# Patient Record
Sex: Female | Born: 1975 | Hispanic: No | Smoking: Never smoker
Health system: Southern US, Community
[De-identification: ages and names within clinical notes are randomized; demographics above are authoritative.]

## PROBLEM LIST (undated history)

## (undated) ENCOUNTER — Inpatient Hospital Stay (HOSPITAL_COMMUNITY): Payer: Self-pay

## (undated) DIAGNOSIS — E663 Overweight: Secondary | ICD-10-CM

## (undated) DIAGNOSIS — G43909 Migraine, unspecified, not intractable, without status migrainosus: Secondary | ICD-10-CM

## (undated) DIAGNOSIS — R51 Headache: Secondary | ICD-10-CM

## (undated) DIAGNOSIS — I10 Essential (primary) hypertension: Secondary | ICD-10-CM

## (undated) DIAGNOSIS — J302 Other seasonal allergic rhinitis: Secondary | ICD-10-CM

## (undated) HISTORY — PX: TUBAL LIGATION: SHX77

## (undated) HISTORY — DX: Migraine, unspecified, not intractable, without status migrainosus: G43.909

## (undated) HISTORY — DX: Overweight: E66.3

## (undated) HISTORY — PX: SKIN GRAFT: SHX250

## (undated) HISTORY — PX: FINGER SURGERY: SHX640

## (undated) HISTORY — PX: INDUCED ABORTION: SHX677

---

## 2006-07-11 ENCOUNTER — Emergency Department (HOSPITAL_COMMUNITY): Admission: EM | Admit: 2006-07-11 | Discharge: 2006-07-11 | Payer: Self-pay | Admitting: Emergency Medicine

## 2007-07-15 ENCOUNTER — Emergency Department (HOSPITAL_COMMUNITY): Admission: EM | Admit: 2007-07-15 | Discharge: 2007-07-15 | Payer: Self-pay | Admitting: Emergency Medicine

## 2007-08-18 ENCOUNTER — Ambulatory Visit (HOSPITAL_COMMUNITY): Admission: RE | Admit: 2007-08-18 | Discharge: 2007-08-18 | Payer: Self-pay | Admitting: Obstetrics & Gynecology

## 2010-04-12 ENCOUNTER — Emergency Department (HOSPITAL_COMMUNITY): Admission: EM | Admit: 2010-04-12 | Discharge: 2010-04-12 | Payer: Self-pay | Admitting: Emergency Medicine

## 2010-04-13 ENCOUNTER — Inpatient Hospital Stay (HOSPITAL_COMMUNITY)
Admission: AD | Admit: 2010-04-13 | Discharge: 2010-04-13 | Payer: Self-pay | Source: Home / Self Care | Admitting: Obstetrics & Gynecology

## 2010-04-15 ENCOUNTER — Inpatient Hospital Stay (HOSPITAL_COMMUNITY)
Admission: AD | Admit: 2010-04-15 | Discharge: 2010-04-15 | Payer: Self-pay | Source: Home / Self Care | Admitting: Obstetrics & Gynecology

## 2010-04-21 ENCOUNTER — Ambulatory Visit (HOSPITAL_COMMUNITY): Admission: RE | Admit: 2010-04-21 | Payer: Self-pay | Admitting: Obstetrics & Gynecology

## 2010-04-30 ENCOUNTER — Ambulatory Visit: Payer: Self-pay | Admitting: Obstetrics & Gynecology

## 2010-05-15 ENCOUNTER — Ambulatory Visit: Payer: Self-pay | Admitting: Obstetrics and Gynecology

## 2010-06-08 ENCOUNTER — Encounter: Payer: Self-pay | Admitting: Obstetrics & Gynecology

## 2010-07-29 LAB — URINALYSIS, ROUTINE W REFLEX MICROSCOPIC
Bilirubin Urine: NEGATIVE
Glucose, UA: NEGATIVE mg/dL
Specific Gravity, Urine: 1.027 (ref 1.005–1.030)
pH: 5 (ref 5.0–8.0)

## 2010-07-29 LAB — URINE MICROSCOPIC-ADD ON

## 2010-07-29 LAB — POCT PREGNANCY, URINE: Preg Test, Ur: POSITIVE

## 2010-07-29 LAB — GC/CHLAMYDIA PROBE AMP, GENITAL: GC Probe Amp, Genital: NEGATIVE

## 2010-07-30 LAB — RH IMMUNE GLOBULIN WORKUP (NOT WOMEN'S HOSP)
ABO/RH(D): A NEG
Antibody Screen: NEGATIVE
Unit division: 0

## 2010-07-30 LAB — CBC
HCT: 37.3 % (ref 36.0–46.0)
MCV: 96.9 fL (ref 78.0–100.0)
Platelets: 248 10*3/uL (ref 150–400)
RBC: 3.85 MIL/uL — ABNORMAL LOW (ref 3.87–5.11)
WBC: 9.2 10*3/uL (ref 4.0–10.5)

## 2010-07-30 LAB — GC/CHLAMYDIA PROBE AMP, GENITAL
Chlamydia, DNA Probe: NEGATIVE
GC Probe Amp, Genital: NEGATIVE

## 2010-07-30 LAB — ABO/RH: ABO/RH(D): A NEG

## 2012-03-09 ENCOUNTER — Inpatient Hospital Stay (HOSPITAL_COMMUNITY)
Admission: AD | Admit: 2012-03-09 | Discharge: 2012-03-09 | Disposition: A | Discharge status: Home / Self Care | Payer: Medicaid Other | Source: Ambulatory Visit | Attending: Obstetrics & Gynecology | Admitting: Obstetrics & Gynecology

## 2012-03-09 ENCOUNTER — Encounter (HOSPITAL_COMMUNITY): Payer: Self-pay | Admitting: *Deleted

## 2012-03-09 DIAGNOSIS — O34219 Maternal care for unspecified type scar from previous cesarean delivery: Secondary | ICD-10-CM | POA: Insufficient documentation

## 2012-03-09 DIAGNOSIS — R109 Unspecified abdominal pain: Secondary | ICD-10-CM | POA: Insufficient documentation

## 2012-03-09 DIAGNOSIS — K5900 Constipation, unspecified: Secondary | ICD-10-CM

## 2012-03-09 DIAGNOSIS — O26899 Other specified pregnancy related conditions, unspecified trimester: Secondary | ICD-10-CM

## 2012-03-09 DIAGNOSIS — O99891 Other specified diseases and conditions complicating pregnancy: Secondary | ICD-10-CM | POA: Insufficient documentation

## 2012-03-09 HISTORY — DX: Other seasonal allergic rhinitis: J30.2

## 2012-03-09 HISTORY — DX: Headache: R51

## 2012-03-09 LAB — POCT PREGNANCY, URINE: Preg Test, Ur: POSITIVE — AB

## 2012-03-09 LAB — WET PREP, GENITAL: Clue Cells Wet Prep HPF POC: NONE SEEN

## 2012-03-09 LAB — URINALYSIS, ROUTINE W REFLEX MICROSCOPIC
Bilirubin Urine: NEGATIVE
Leukocytes, UA: NEGATIVE
Nitrite: NEGATIVE
Specific Gravity, Urine: 1.015 (ref 1.005–1.030)
Urobilinogen, UA: 0.2 mg/dL (ref 0.0–1.0)
pH: 5.5 (ref 5.0–8.0)

## 2012-03-09 MED ORDER — PROMETHAZINE HCL 25 MG PO TABS: 25.0000 mg | ORAL_TABLET | Freq: Four times a day (QID) | ORAL | Status: DC | PRN

## 2012-03-09 MED ORDER — PRENATAL VITAMINS 28-0.8 MG PO TABS: 1.0000 | ORAL_TABLET | Freq: Every day | ORAL | Status: DC

## 2012-03-09 NOTE — MAU Provider Note (Signed)
History     CSN: [id number]  Arrival date and time: 03/09/12 1251   First Provider Initiated Contact with Patient 03/09/12 1512      Chief Complaint  Patient presents with  . Abdominal Pain   HPI This is a 36 y.o. female at [redacted]w[redacted]d who presents with c/o lower abdominal pain and nausea, since 1700. Had a c/s at [facility] and was told she had "everything stuck together".  Is worried this pregnancy "is dangerous because of that".  No bleeding. Does report constipation.  Wants to come here for [facility].   RN Note: Hx of 2 previous c-sections; reports problems from adhesions from her previous c-sections; unable to doppler FHR; pt thought that she was between 9-[redacted] weeks gestation but by LNMP, pt is around [redacted]w[redacted]d;denies bleeding; hx of miscarriage and is concerned that she will miscarry again;       OB History    Grav Para Term Preterm Abortions TAB SAB Ect Mult Living   5 2 2  2 1 1   2       Past Medical History  Diagnosis Date  . Headache   . Seasonal allergies     Past Surgical History  Procedure Date  . Cesarean section   . Induced abortion   . Skin graft   . Finger surgery     Family History  Problem Relation Age of Onset  . Other Neg Hx     History  Substance Use Topics  . Smoking status: Never Smoker   . Smokeless tobacco: Not on file  . Alcohol Use: No    Allergies:  Allergies  Allergen Reactions  . Zofran (Ondansetron Hcl) Other (See Comments)    Headache  . Benadryl (Diphenhydramine Hcl) Hives, Swelling and Rash    Prescriptions prior to admission  Medication Sig Dispense Refill  . HYDROcodone-acetaminophen (NORCO/VICODIN) 5-325 MG per tablet Take 1 tablet by mouth every 6 (six) hours as needed. For pain      . DISCONTD: bismuth subsalicylate (PEPTO BISMOL) 262 MG/15ML suspension Take 15 mLs by mouth every 6 (six) hours as needed. For nausea/upset stomach        ROS See HPI  Physical Exam   Blood pressure 136/62, pulse 115, temperature 99 F (37.2 C),  temperature source Oral, resp. rate 16, height 5\' 2"  (1.575 m), weight 122 lb (55.339 kg), last menstrual period 01/11/2012.  Physical Exam  Constitutional: She is oriented to person, place, and time. She appears well-developed and well-nourished. No distress.  Cardiovascular: Normal rate.   Respiratory: Effort normal.  GI: Soft. She exhibits no distension and no mass. There is tenderness (slight tenderness lower abdomen, no focal tenderness). There is no rebound and no guarding.  Genitourinary: Vagina normal and uterus normal. No vaginal discharge found.  Musculoskeletal: Normal range of motion.  Neurological: She is alert and oriented to person, place, and time.  Skin: Skin is warm and dry.  Psychiatric: She has a normal mood and affect.   Bedside US revealed active 10-12 week fetus FHR 150s  MAU Course  Procedures  MDM GC/Ch/wet prep done  Results for orders placed during the hospital encounter of 03/09/12 (from the past 24 hour(s))  URINALYSIS, ROUTINE W REFLEX MICROSCOPIC     Status: Abnormal   Collection Time   03/09/12  1:20 PM      Component Value Range   Color, Urine YELLOW  YELLOW   APPearance HAZY (*) CLEAR   Specific Gravity, Urine 1.015  1.005 - 1.030  pH 5.5  5.0 - 8.0   Glucose, UA NEGATIVE  NEGATIVE mg/dL   Hgb urine dipstick NEGATIVE  NEGATIVE   Bilirubin Urine NEGATIVE  NEGATIVE   Ketones, ur NEGATIVE  NEGATIVE mg/dL   Protein, ur NEGATIVE  NEGATIVE mg/dL   Urobilinogen, UA 0.2  0.0 - 1.0 mg/dL   Nitrite NEGATIVE  NEGATIVE   Leukocytes, UA NEGATIVE  NEGATIVE  POCT PREGNANCY, URINE     Status: Abnormal   Collection Time   03/09/12  1:35 PM      Component Value Range   Preg Test, Ur POSITIVE (*) NEGATIVE  WET PREP, GENITAL     Status: Abnormal   Collection Time   03/09/12  2:35 PM      Component Value Range   Yeast Wet Prep HPF POC NONE SEEN  NONE SEEN   Trich, Wet Prep NONE SEEN  NONE SEEN   Clue Cells Wet Prep HPF POC NONE SEEN  NONE SEEN   WBC,  Wet Prep HPF POC FEW (*) NONE SEEN     Assessment and Plan  A:  SIUP at [redacted]w[redacted]d by LMP, 10-12 wks by appearance, though no CRL was done      Abdominal cramping probably related to constipation.      Previous C/S            P:  Discharge home      Reassured       Recommend Miralax PRN       Start PNC. Wants to come to Clinic for [facility] Already has Medicaid  [facility] 03/09/2012, 3:12 PM

## 2012-03-09 NOTE — MAU Note (Signed)
Hx of 2 previous c-sections; reports problems from adhesions from her previous c-sections; unable to doppler FHR; pt thought that she was between 9-[redacted] weeks gestation but by LNMP, pt is around [redacted]w[redacted]d;denies bleeding; hx of miscarriage and is concerned that she will miscarry again;

## 2012-03-09 NOTE — MAU Note (Signed)
C/o abdominal pain since yesterday around 1700; denies vaginal bleeding;

## 2012-03-10 LAB — GC/CHLAMYDIA PROBE AMP, GENITAL
Chlamydia, DNA Probe: NEGATIVE
GC Probe Amp, Genital: NEGATIVE

## 2012-03-11 ENCOUNTER — Encounter: Payer: Self-pay | Admitting: Advanced Practice Midwife

## 2012-03-18 NOTE — MAU Provider Note (Signed)
Medical Screening exam and patient care preformed by advanced practice provider.  Agree with the above management.  

## 2012-03-30 ENCOUNTER — Encounter: Payer: Medicaid Other | Admitting: Advanced Practice Midwife

## 2012-08-14 ENCOUNTER — Encounter (HOSPITAL_COMMUNITY): Payer: Self-pay

## 2012-08-14 ENCOUNTER — Inpatient Hospital Stay (HOSPITAL_COMMUNITY)
Admission: AD | Admit: 2012-08-14 | Discharge: 2012-08-14 | Disposition: A | Discharge status: Home / Self Care | Payer: Medicaid Other | Source: Ambulatory Visit | Attending: Obstetrics & Gynecology | Admitting: Obstetrics & Gynecology

## 2012-08-14 DIAGNOSIS — O99891 Other specified diseases and conditions complicating pregnancy: Secondary | ICD-10-CM | POA: Insufficient documentation

## 2012-08-14 DIAGNOSIS — N949 Unspecified condition associated with female genital organs and menstrual cycle: Secondary | ICD-10-CM | POA: Insufficient documentation

## 2012-08-14 DIAGNOSIS — O4700 False labor before 37 completed weeks of gestation, unspecified trimester: Secondary | ICD-10-CM | POA: Insufficient documentation

## 2012-08-14 DIAGNOSIS — R109 Unspecified abdominal pain: Secondary | ICD-10-CM | POA: Insufficient documentation

## 2012-08-14 DIAGNOSIS — K5900 Constipation, unspecified: Secondary | ICD-10-CM | POA: Insufficient documentation

## 2012-08-14 LAB — URINALYSIS, ROUTINE W REFLEX MICROSCOPIC
Bilirubin Urine: NEGATIVE
Glucose, UA: 250 mg/dL — AB
Hgb urine dipstick: NEGATIVE
Specific Gravity, Urine: 1.02 (ref 1.005–1.030)
Urobilinogen, UA: 0.2 mg/dL (ref 0.0–1.0)
pH: 6.5 (ref 5.0–8.0)

## 2012-08-14 NOTE — MAU Note (Signed)
Pt reports feeling not well this early this morning. Pt reports pressure and cramping and an increase in FM. Denies bleeding or leaking of fluid.

## 2012-08-14 NOTE — MAU Provider Note (Signed)
History     CSN: [id number]  Arrival date and time: 08/14/12 4098   First Provider Initiated Contact with Patient 08/14/12 0113      Chief Complaint  Patient presents with  . Abdominal Cramping   HPI  Pt is a [id number] here at 34.2 wks IUP with report of pelvic pressure and not  feeling well early this morning. Pt reports pressure and cramping and an increase in FM. Denies bleeding or leaking of fluid.  Began prenatal care at [facility] during the first trimester.  Reports no problems during the pregnancy.     Past Medical History  Diagnosis Date  . Headache   . Seasonal allergies     Past Surgical History  Procedure Laterality Date  . Cesarean section    . Induced abortion    . Skin graft    . Finger surgery      Family History  Problem Relation Age of Onset  . Other Neg Hx     History  Substance Use Topics  . Smoking status: Never Smoker   . Smokeless tobacco: Not on file  . Alcohol Use: No    Allergies:  Allergies  Allergen Reactions  . Zofran (Ondansetron Hcl) Other (See Comments)    Headache  . Benadryl (Diphenhydramine Hcl) Hives, Swelling and Rash    Prescriptions prior to admission  Medication Sig Dispense Refill  . Prenatal Vit-Fe Fumarate-FA (PRENATAL VITAMINS) 28-0.8 MG TABS Take 1 tablet by mouth daily.  30 tablet  12  . HYDROcodone-acetaminophen (NORCO/VICODIN) 5-325 MG per tablet Take 1 tablet by mouth every 6 (six) hours as needed. For pain      . promethazine (PHENERGAN) 25 MG tablet Take 1 tablet (25 mg total) by mouth every 6 (six) hours as needed for nausea.  30 tablet  0    Review of Systems  Gastrointestinal: Positive for abdominal pain (pressure in vaginal area) and constipation.  All other systems reviewed and are negative.   Physical Exam   Blood pressure 126/70, pulse 97, temperature 98.7 F (37.1 C), temperature source Oral, resp. rate 18, height 5\' 2"  (1.575 m), weight 71.396 kg (157 lb 6.4 oz), last menstrual period  01/11/2012.  Physical Exam  Constitutional: She is oriented to person, place, and time. She appears well-developed and well-nourished.  HENT:  Head: Normocephalic.  Neck: Normal range of motion. Neck supple.  Cardiovascular: Normal rate, regular rhythm and normal heart sounds.   Respiratory: Effort normal and breath sounds normal.  GI: Soft. There is no tenderness.  Genitourinary: No bleeding around the vagina. Vaginal discharge (mucusy) found.  Feces palpated in rectum thru vaginal wall  Neurological: She is alert and oriented to person, place, and time.  Skin: Skin is warm and dry.    MAU Course  Procedures  Results for orders placed during the hospital encounter of 08/14/12 (from the past 24 hour(s))  URINALYSIS, ROUTINE W REFLEX MICROSCOPIC     Status: Abnormal   Collection Time    08/14/12 12:31 AM      Result Value Range   Color, Urine YELLOW  YELLOW   APPearance CLEAR  CLEAR   Specific Gravity, Urine 1.020  1.005 - 1.030   pH 6.5  5.0 - 8.0   Glucose, UA 250 (*) NEGATIVE mg/dL   Hgb urine dipstick NEGATIVE  NEGATIVE   Bilirubin Urine NEGATIVE  NEGATIVE   Ketones, ur NEGATIVE  NEGATIVE mg/dL   Protein, ur NEGATIVE  NEGATIVE mg/dL   Urobilinogen, UA  0.2  0.0 - 1.0 mg/dL   Nitrite NEGATIVE  NEGATIVE   Leukocytes, UA NEGATIVE  NEGATIVE   Dilation: 1 Effacement (%): Thick Cervical Position: Posterior Station: Ballotable Presentation: Vertex Exam by:: B Mosca RN  Assessment and Plan  Constipation Category I FHR Tracing Braxton Hick's - no cervical change  Plan: DC to home Miralax Keep scheduled appt Preterm labor precautions.  [facility] 08/14/2012, 1:15 AM

## 2012-08-17 NOTE — MAU Provider Note (Signed)
Attestation of Attending Supervision of Advanced Practitioner (PA/CNM/NP): Evaluation and management procedures were performed by the Advanced Practitioner under my supervision and collaboration.  I have reviewed the Advanced Practitioner's note and chart, and I agree with the management and plan.  Kaedan Richert, MD, FACOG Attending Obstetrician & Gynecologist Faculty Practice, [facility]  

## 2012-09-29 ENCOUNTER — Telehealth (HOSPITAL_COMMUNITY): Payer: Self-pay | Admitting: *Deleted

## 2012-09-29 NOTE — Telephone Encounter (Signed)
Preadmission screen  

## 2013-01-12 ENCOUNTER — Encounter (HOSPITAL_COMMUNITY): Payer: Self-pay | Admitting: *Deleted

## 2014-03-19 ENCOUNTER — Encounter (HOSPITAL_COMMUNITY): Payer: Self-pay | Admitting: *Deleted

## 2014-05-08 ENCOUNTER — Emergency Department (HOSPITAL_COMMUNITY)
Admission: EM | Admit: 2014-05-08 | Discharge: 2014-05-08 | Disposition: A | Discharge status: Home / Self Care | Payer: Medicaid Other | Attending: Emergency Medicine | Admitting: Emergency Medicine

## 2014-05-08 ENCOUNTER — Emergency Department (HOSPITAL_COMMUNITY): Payer: Medicaid Other

## 2014-05-08 ENCOUNTER — Encounter (HOSPITAL_COMMUNITY): Payer: Self-pay

## 2014-05-08 DIAGNOSIS — R067 Sneezing: Secondary | ICD-10-CM | POA: Insufficient documentation

## 2014-05-08 DIAGNOSIS — Y929 Unspecified place or not applicable: Secondary | ICD-10-CM | POA: Insufficient documentation

## 2014-05-08 DIAGNOSIS — I10 Essential (primary) hypertension: Secondary | ICD-10-CM | POA: Insufficient documentation

## 2014-05-08 DIAGNOSIS — S00531A Contusion of lip, initial encounter: Secondary | ICD-10-CM | POA: Insufficient documentation

## 2014-05-08 DIAGNOSIS — T07XXXA Unspecified multiple injuries, initial encounter: Secondary | ICD-10-CM

## 2014-05-08 DIAGNOSIS — S3991XA Unspecified injury of abdomen, initial encounter: Secondary | ICD-10-CM | POA: Insufficient documentation

## 2014-05-08 DIAGNOSIS — S0083XA Contusion of other part of head, initial encounter: Secondary | ICD-10-CM | POA: Insufficient documentation

## 2014-05-08 DIAGNOSIS — Y998 Other external cause status: Secondary | ICD-10-CM | POA: Insufficient documentation

## 2014-05-08 DIAGNOSIS — Y9389 Activity, other specified: Secondary | ICD-10-CM | POA: Insufficient documentation

## 2014-05-08 HISTORY — DX: Essential (primary) hypertension: I10

## 2014-05-08 MED ORDER — HYDROCODONE-ACETAMINOPHEN 5-325 MG PO TABS
1.0000 | ORAL_TABLET | Freq: Once | ORAL | Status: AC
  Administered 2014-05-08: 1 via ORAL
  Filled 2014-05-08: qty 1

## 2014-05-08 MED ORDER — DIAZEPAM 5 MG PO TABS
5.0000 mg | ORAL_TABLET | Freq: Once | ORAL | Status: AC
  Administered 2014-05-08: 5 mg via ORAL
  Filled 2014-05-08: qty 1

## 2014-05-08 MED ORDER — HYDROCODONE-ACETAMINOPHEN 5-325 MG PO TABS: 1.0000 | ORAL_TABLET | ORAL | Status: DC | PRN
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
## 2014-05-08 NOTE — Discharge Instructions (Signed)
Your xrays and ct are negative for fracture or dislocation. Cool compress to the forehead may be helpful. Use tylenol or ibuprofen for mild pain. Use norco for more severe pain. Assault, General Assault includes any behavior, whether intentional or reckless, which results in bodily injury to another person and/or damage to property. Included in this would be any behavior, intentional or reckless, that by its nature would be understood (interpreted) by a reasonable person as intent to harm another person or to damage his/her property. Threats may be oral or written. They may be communicated through regular mail, computer, fax, or phone. These threats may be direct or implied. FORMS OF ASSAULT INCLUDE:  Physically assaulting a person. This includes physical threats to inflict physical harm as well as:  Slapping.  Hitting.  Poking.  Kicking.  Punching.  Pushing.  Arson.  Sabotage.  Equipment vandalism.  Damaging or destroying property.  Throwing or hitting objects.  Displaying a weapon or an object that appears to be a weapon in a threatening manner.  Carrying a firearm of any kind.  Using a weapon to harm someone.  Using greater physical size/strength to intimidate another.  Making intimidating or threatening gestures.  Bullying.  Hazing.  Intimidating, threatening, hostile, or abusive language directed toward another person.  It communicates the intention to engage in violence against that person. And it leads a reasonable person to expect that violent behavior may occur.  Stalking another person. IF IT HAPPENS AGAIN:  Immediately call for emergency help (911 in [place]  If someone poses clear and immediate danger to you, seek legal authorities to have a protective or restraining order put in place.  Less threatening assaults can at least be reported to authorities. STEPS TO TAKE IF A SEXUAL ASSAULT HAS HAPPENED  Go to an area of safety. This may include a  shelter or staying with a friend. Stay away from the area where you have been attacked. A large percentage of sexual assaults are caused by a friend, relative or associate.  If medications were given by your caregiver, take them as directed for the full length of time prescribed.  Only take over-the-counter or prescription medicines for pain, discomfort, or fever as directed by your caregiver.  If you have come in contact with a sexual disease, find out if you are to be tested again. If your caregiver is concerned about the HIV/AIDS virus, he/she may require you to have continued testing for several months.  For the protection of your privacy, test results can not be given over the phone. Make sure you receive the results of your test. If your test results are not back during your visit, make an appointment with your caregiver to find out the results. Do not assume everything is normal if you have not heard from your caregiver or the medical facility. It is important for you to follow up on all of your test results.  File appropriate papers with authorities. This is important in all assaults, even if it has occurred in a family or by a friend. SEEK MEDICAL CARE IF:  You have new problems because of your injuries.  You have problems that may be because of the medicine you are taking, such as:  Rash.  Itching.  Swelling.  Trouble breathing.  You develop belly (abdominal) pain, feel sick to your stomach (nausea) or are vomiting.  You begin to run a temperature.  You need supportive care or referral to a rape crisis center. These are centers with  trained personnel who can help you get through this ordeal. SEEK IMMEDIATE MEDICAL CARE IF:  You are afraid of being threatened, beaten, or abused. In [place] call 911.  You receive new injuries related to abuse.  You develop severe pain in any area injured in the assault or have any change in your condition that concerns you.  You faint or  lose consciousness.  You develop chest pain or shortness of breath. Document Released: 05/04/2005 Document Revised: 07/27/2011 Document Reviewed: 12/21/2007 [facility] Patient Information 2015 [employer], [place]. This information is not intended to replace advice given to you by your health care provider. Make sure you discuss any questions you have with your health care provider.

## 2014-05-09 LAB — POC URINE PREG, ED: PREG TEST UR: NEGATIVE

## 2015-04-02 ENCOUNTER — Other Ambulatory Visit (HOSPITAL_COMMUNITY): Payer: Self-pay | Admitting: Nurse Practitioner

## 2015-04-02 ENCOUNTER — Ambulatory Visit (HOSPITAL_COMMUNITY)
Admission: RE | Admit: 2015-04-02 | Discharge: 2015-04-02 | Disposition: A | Discharge status: Home / Self Care | Payer: BLUE CROSS/BLUE SHIELD | Source: Ambulatory Visit | Attending: Nurse Practitioner | Admitting: Nurse Practitioner

## 2015-04-02 DIAGNOSIS — J069 Acute upper respiratory infection, unspecified: Secondary | ICD-10-CM | POA: Insufficient documentation

## 2015-04-02 DIAGNOSIS — R918 Other nonspecific abnormal finding of lung field: Secondary | ICD-10-CM | POA: Diagnosis not present

## 2015-04-02 DIAGNOSIS — R0600 Dyspnea, unspecified: Secondary | ICD-10-CM | POA: Insufficient documentation
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
## 2016-04-14 ENCOUNTER — Encounter (HOSPITAL_COMMUNITY): Payer: Self-pay | Admitting: *Deleted

## 2016-04-14 ENCOUNTER — Emergency Department (HOSPITAL_COMMUNITY)
Admission: EM | Admit: 2016-04-14 | Discharge: 2016-04-14 | Disposition: A | Discharge status: Home / Self Care | Payer: BLUE CROSS/BLUE SHIELD | Attending: Emergency Medicine | Admitting: Emergency Medicine

## 2016-04-14 DIAGNOSIS — T161XXA Foreign body in right ear, initial encounter: Secondary | ICD-10-CM

## 2016-04-14 DIAGNOSIS — I10 Essential (primary) hypertension: Secondary | ICD-10-CM | POA: Insufficient documentation

## 2016-04-14 DIAGNOSIS — Y999 Unspecified external cause status: Secondary | ICD-10-CM | POA: Insufficient documentation

## 2016-04-14 DIAGNOSIS — Z79899 Other long term (current) drug therapy: Secondary | ICD-10-CM | POA: Insufficient documentation

## 2016-04-14 DIAGNOSIS — Y939 Activity, unspecified: Secondary | ICD-10-CM | POA: Insufficient documentation

## 2016-04-14 DIAGNOSIS — X58XXXA Exposure to other specified factors, initial encounter: Secondary | ICD-10-CM | POA: Insufficient documentation

## 2016-04-14 DIAGNOSIS — Y929 Unspecified place or not applicable: Secondary | ICD-10-CM | POA: Insufficient documentation

## 2016-04-14 MED ORDER — NEOMYCIN-POLYMYXIN-HC 1 % OT SOLN
4.0000 [drp] | Freq: Four times a day (QID) | OTIC | Status: DC
  Administered 2016-04-14: 4 [drp] via OTIC
  Filled 2016-04-14: qty 10

## 2016-04-14 MED ORDER — LIDOCAINE VISCOUS 2 % MT SOLN
15.0000 mL | Freq: Once | OROMUCOSAL | Status: AC
  Administered 2016-04-14: 15 mL via OROMUCOSAL
  Filled 2016-04-14: qty 15

## 2016-04-14 NOTE — ED Triage Notes (Signed)
Pt states she woke up to feel something crawling in her right ear

## 2016-04-14 NOTE — Discharge Instructions (Signed)
Use the ear drops 4 times a day to prevent an infection in your ear canal from the roach.  Call Dr Avel Sensoreoh's office in the morning to get an appointment to have him remove the bug.

## 2016-04-14 NOTE — ED Provider Notes (Signed)
[facility]-EMERGENCY DEPT Provider Note   CSN: [id number] Arrival date & time: 04/14/16  0201  Time seen 03:00 AM   History   Chief Complaint Chief Complaint  Patient presents with  . Foreign Body in Ear    HPI Gloria Parsons is a 40 y.o. female.  HPI patient reports she was sleeping and she felt like something was in her right ear about 1 AM. She states she continues to feel like something is moving around in her right ear canal. She states she's never had this before.  PCP none  Past Medical History:  Diagnosis Date  . Headache(784.0)   . Hypertension   . Seasonal allergies     There are no active problems to display for this patient.   Past Surgical History:  Procedure Laterality Date  . CESAREAN SECTION    . FINGER SURGERY    . INDUCED ABORTION    . SKIN GRAFT    . TUBAL LIGATION      OB History    Gravida Para Term Preterm AB Living   5 2 2   2 2    SAB TAB Ectopic Multiple Live Births   1 1     2        Home Medications    Prior to Admission medications   Medication Sig Start Date End Date Taking? Authorizing Provider  HYDROcodone-acetaminophen (NORCO/VICODIN) 5-325 MG per tablet Take 1 tablet by mouth every 4 (four) hours as needed. 05/08/14   Ivery QualeHobson Bryant, PA-C  Multiple Vitamin (MULTIVITAMIN WITH MINERALS) TABS tablet Take 1 tablet by mouth daily.    Historical Provider, MD  Prenatal Vit-Fe Fumarate-FA (PRENATAL VITAMINS) 28-0.8 MG TABS Take 1 tablet by mouth daily. Patient not taking: Reported on 05/08/2014 03/09/12   Aviva SignsMarie L Williams, CNM    Family History Family History  Problem Relation Age of Onset  . Other Neg Hx     Social History Social History  Substance Use Topics  . Smoking status: Never Smoker  . Smokeless tobacco: Never Used  . Alcohol use No     Allergies   Zofran [ondansetron hcl] and Benadryl [diphenhydramine hcl]   Review of Systems Review of Systems  All other systems reviewed and are negative.    Physical  Exam Updated Vital Signs BP 142/86   Pulse 112   Temp 98.1 F (36.7 C)   Resp 17   Ht 5\' 3"  (1.6 m)   Wt 155 lb (70.3 kg)   LMP 03/31/2016   SpO2 100%   BMI 27.46 kg/m   Vital signs normal except for tachycardia   Physical Exam  Constitutional: She is oriented to person, place, and time. She appears well-developed and well-nourished.  HENT:  Head: Normocephalic.  Left Ear: External ear normal.  Nose: Nose normal.  There is a bug in her right ear canal that appears to be a roach  Eyes: Conjunctivae and EOM are normal. Pupils are equal, round, and reactive to light.  Neck: Normal range of motion.  Cardiovascular: Normal rate.   Pulmonary/Chest: She is in respiratory distress.  Musculoskeletal: Normal range of motion.  Neurological: She is alert and oriented to person, place, and time. A cranial nerve deficit is present.  Skin: Skin is warm and dry. No rash noted.  Psychiatric: Her speech is normal and behavior is normal. Her mood appears anxious.      Procedures Procedures (including critical care time)  Medications Ordered in ED Medications  NEOMYCIN-POLYMYXIN-HYDROCORTISONE (CORTISPORIN) otic solution 4 drop (  not administered)  lidocaine (XYLOCAINE) 2 % viscous mouth solution 15 mL (15 mLs Mouth/Throat Given 04/14/16 0305)     Initial Impression / Assessment and Plan / ED Course  I have reviewed the triage vital signs and the nursing notes.  Pertinent labs & imaging results that were available during my care of the patient were reviewed by me and considered in my medical decision making (see chart for details).  Clinical Course    Viscous Xylocaine was placed in the right ear canal to kill the insect. Nursing staff and attempted to flush the bug out of her ear canal. However the insect did move closer to her TM. At 4:10 AM when I rechecked her it was sitting on her TM. She is no longer feeling the bug moving is not having any pain. At this point I feel the bug is  too far in her ear canal for me to try to attempt removal without causing some injury to her tympanic membrane or ear canal. She is being referred to Dr.Teoh, ENT. She was started on Cortisporin otic to help prevent infection of her ear canal from the trauma of the live insect.  Final Clinical Impressions(s) / ED Diagnoses   Final diagnoses:  Foreign body of right ear, initial encounter   Medication Cortisporin otic 4 drops in right ear 4 times a day  Plan discharge  Devoria AlbeIva Kinan Safley, MD, Concha PyoFACEP     Skii Cleland, MD 04/14/16 [phone]

## 2016-04-15 DIAGNOSIS — T161XXA Foreign body in right ear, initial encounter: Secondary | ICD-10-CM | POA: Insufficient documentation

## 2018-03-10 ENCOUNTER — Encounter (HOSPITAL_COMMUNITY): Payer: Self-pay | Admitting: Emergency Medicine

## 2018-03-10 ENCOUNTER — Emergency Department (HOSPITAL_COMMUNITY)
Admission: EM | Admit: 2018-03-10 | Discharge: 2018-03-10 | Disposition: A | Discharge status: Home / Self Care | Payer: Self-pay | Attending: Emergency Medicine | Admitting: Emergency Medicine

## 2018-03-10 ENCOUNTER — Other Ambulatory Visit: Payer: Self-pay

## 2018-03-10 ENCOUNTER — Emergency Department (HOSPITAL_COMMUNITY): Payer: Self-pay

## 2018-03-10 DIAGNOSIS — J302 Other seasonal allergic rhinitis: Secondary | ICD-10-CM | POA: Insufficient documentation

## 2018-03-10 DIAGNOSIS — R000 Tachycardia, unspecified: Secondary | ICD-10-CM | POA: Insufficient documentation

## 2018-03-10 DIAGNOSIS — Z79899 Other long term (current) drug therapy: Secondary | ICD-10-CM | POA: Insufficient documentation

## 2018-03-10 DIAGNOSIS — I10 Essential (primary) hypertension: Secondary | ICD-10-CM | POA: Insufficient documentation

## 2018-03-10 DIAGNOSIS — F439 Reaction to severe stress, unspecified: Secondary | ICD-10-CM

## 2018-03-10 DIAGNOSIS — F419 Anxiety disorder, unspecified: Secondary | ICD-10-CM | POA: Insufficient documentation

## 2018-03-10 LAB — BASIC METABOLIC PANEL
ANION GAP: 9 (ref 5–15)
BUN: 5 mg/dL — ABNORMAL LOW (ref 6–20)
CHLORIDE: 108 mmol/L (ref 98–111)
CO2: 23 mmol/L (ref 22–32)
Calcium: 9.5 mg/dL (ref 8.9–10.3)
Creatinine, Ser: 0.82 mg/dL (ref 0.44–1.00)
GFR calc non Af Amer: >60 mL/min (ref >60)
GLUCOSE: 114 mg/dL — AB (ref 70–99)
POTASSIUM: 3.3 mmol/L — AB (ref 3.5–5.1)
Sodium: 140 mmol/L (ref 135–145)

## 2018-03-10 LAB — CBC
HEMATOCRIT: 43.6 % (ref 36.0–46.0)
HEMOGLOBIN: 14.1 g/dL (ref 12.0–15.0)
MCH: 30.5 pg (ref 26.0–34.0)
MCHC: 32.3 g/dL (ref 30.0–36.0)
MCV: 94.2 fL (ref 80.0–100.0)
NRBC: 0 % (ref 0.0–0.2)
Platelets: 407 10*3/uL — ABNORMAL HIGH (ref 150–400)
RBC: 4.63 MIL/uL (ref 3.87–5.11)
RDW: 12.4 % (ref 11.5–15.5)
WBC: 8.6 10*3/uL (ref 4.0–10.5)

## 2018-03-10 LAB — I-STAT BETA HCG BLOOD, ED (MC, WL, AP ONLY): I-stat hCG, quantitative: <5 m[IU]/mL (ref <5)

## 2018-03-10 LAB — BRAIN NATRIURETIC PEPTIDE: B NATRIURETIC PEPTIDE 5: 24 pg/mL (ref 0.0–100.0)

## 2018-03-10 LAB — I-STAT TROPONIN, ED: Troponin i, poc: 0 ng/mL (ref 0.00–0.08)

## 2018-03-10 NOTE — ED Triage Notes (Signed)
States having dizziness and high blood pressure, feels short of breath and chest pain. States is under a lot of stress also. 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

## 2018-03-10 NOTE — Discharge Instructions (Signed)
Your history and exam today were consistent with physiologic reactions to anxiety and stress at home.  Your fast heart rate and elevated blood pressure improved after you were able to relax.  Your work-up from a blood work and imaging standpoint is reassuring.  Your exam was completely reassuring.  We feel you are safe to go to your appointment tomorrow to discuss further anxiety management with your primary doctor.  If any symptoms change or worsen, please return to the nearest emergency department.  Please stay hydrated overnight.

## 2018-03-10 NOTE — ED Provider Notes (Signed)
[facility] EMERGENCY DEPARTMENT Provider Note   CSN: [id number] Arrival date & time: 03/10/18  1040     History   Chief Complaint Chief Complaint  Patient presents with  . Hypertension    HPI Gloria Parsons is a 42 y.o. female.  The history is provided by the patient and medical records. No language interpreter was used.  Palpitations   This is a recurrent problem. The current episode started 1 to 2 hours ago. The problem occurs daily. The problem has been resolved. The problem is associated with anxiety and stress. Associated symptoms include shortness of breath. Pertinent negatives include no diaphoresis, no fever, no malaise/fatigue, no chest pain, no chest pressure, no exertional chest pressure, no irregular heartbeat, no near-syncope, no syncope, no abdominal pain, no nausea, no vomiting, no headaches, no back pain, no lower extremity edema, no dizziness, no weakness and no cough. She has tried deep relaxation for the symptoms. The treatment provided significant relief. Risk factors include stress. Her past medical history does not include heart disease.    Past Medical History:  Diagnosis Date  . Headache(784.0)   . Hypertension   . Seasonal allergies     There are no active problems to display for this patient.   Past Surgical History:  Procedure Laterality Date  . CESAREAN SECTION    . FINGER SURGERY    . INDUCED ABORTION    . SKIN GRAFT    . TUBAL LIGATION       OB History    Gravida  5   Para  2   Term  2   Preterm      AB  2   Living  2     SAB  1   TAB  1   Ectopic      Multiple      Live Births  2            Home Medications    Prior to Admission medications   Medication Sig Start Date End Date Taking? Authorizing Provider  HYDROcodone-acetaminophen (NORCO/VICODIN) 5-325 MG per tablet Take 1 tablet by mouth every 4 (four) hours as needed. 05/08/14   Ivery Quale, PA-C  Multiple Vitamin (MULTIVITAMIN  WITH MINERALS) TABS tablet Take 1 tablet by mouth daily.    [provider]  Prenatal Vit-Fe Fumarate-FA (PRENATAL VITAMINS) 28-0.8 MG TABS Take 1 tablet by mouth daily. Patient not taking: Reported on 05/08/2014 03/09/12   Aviva Signs, CNM    Family History Family History  Problem Relation Age of Onset  . Other Neg Hx     Social History Social History   Tobacco Use  . Smoking status: Never Smoker  . Smokeless tobacco: Never Used  Substance Use Topics  . Alcohol use: No  . Drug use: No     Allergies   Zofran [ondansetron hcl] and Benadryl [diphenhydramine hcl]   Review of Systems Review of Systems  Constitutional: Negative for chills, diaphoresis, fatigue, fever and malaise/fatigue.  HENT: Negative for congestion.   Eyes: Negative for visual disturbance.  Respiratory: Positive for chest tightness and shortness of breath. Negative for cough and wheezing.   Cardiovascular: Positive for palpitations. Negative for chest pain, leg swelling, syncope and near-syncope.  Gastrointestinal: Negative for abdominal pain, diarrhea, nausea and vomiting.  Genitourinary: Negative for flank pain.  Musculoskeletal: Negative for back pain, neck pain and neck stiffness.  Neurological: Positive for light-headedness. Negative for dizziness, facial asymmetry, speech difficulty, weakness and headaches.  Hematological: Negative for adenopathy.  Psychiatric/Behavioral: Negative for agitation and confusion.  All other systems reviewed and are negative.    Physical Exam Updated Vital Signs BP (!) 141/92   Pulse 100   Temp 98.6 F (37 C) (Oral)   Resp 20   Ht 5\' 3"  (1.6 m)   Wt 72.6 kg   LMP 03/01/2018 (Approximate) Comment: Tubal Ligation  SpO2 100%   BMI 28.34 kg/m   Physical Exam  Constitutional: She is oriented to person, place, and time. She appears well-developed and well-nourished. No distress.  HENT:  Head: Normocephalic and atraumatic.  Right Ear: External ear  normal.  Left Ear: External ear normal.  Nose: Nose normal.  Mouth/Throat: Oropharynx is clear and moist. No oropharyngeal exudate.  Eyes: Pupils are equal, round, and reactive to light. Conjunctivae and EOM are normal.  Neck: Normal range of motion. Neck supple.  Cardiovascular: Regular rhythm and intact distal pulses. Tachycardia present.  Pulmonary/Chest: Effort normal. No stridor. No respiratory distress. She has no wheezes. She has no rales. She exhibits no tenderness.  Abdominal: She exhibits no distension. There is no tenderness. There is no rebound.  Neurological: She is alert and oriented to person, place, and time. She has normal reflexes. No cranial nerve deficit or sensory deficit. She exhibits normal muscle tone. Coordination normal.  Skin: Skin is warm. Capillary refill takes less than 2 seconds. No rash noted. She is not diaphoretic. No erythema.  Psychiatric: Her mood appears anxious. She is not agitated.  Nursing note and vitals reviewed.    ED Treatments / Results  Labs (all labs ordered are listed, but only abnormal results are displayed) Labs Reviewed  BASIC METABOLIC PANEL - Abnormal; Notable for the following components:      Result Value   Potassium 3.3 (*)    Glucose, Bld 114 (*)    BUN 5 (*)    All other components within normal limits  CBC - Abnormal; Notable for the following components:   Platelets 407 (*)    All other components within normal limits  BRAIN NATRIURETIC PEPTIDE  I-STAT TROPONIN, ED  I-STAT BETA HCG BLOOD, ED ([facility], [facility], [facility] ONLY)    EKG EKG Interpretation  Date/Time:  Thursday March 10 2018 10:44:29 EDT Ventricular Rate:  116 PR Interval:    QRS Duration: 70 QT Interval:  390 QTC Calculation: 542 R Axis:   63 Text Interpretation:  Sinus tachycardia Septal infarct , age undetermined ST & T wave abnormality, consider inferior ischemia Prolonged QT Abnormal ECG No prior ECG for comparison.  Sinus tachycardia No STEMI Reconfirmed by  Theda Belfast ([id number]) on 03/10/2018 1:18:39 PM   Radiology Dg Chest 2 View  Result Date: 03/10/2018 CLINICAL DATA:  Shortness of breath. Tachycardia. Migraine and nausea. EXAM: CHEST - 2 VIEW COMPARISON:  04/02/2015 FINDINGS: Cardiomediastinal silhouette is normal. Mediastinal contours appear intact. There is no evidence of focal airspace consolidation, pleural effusion or pneumothorax. Osseous structures are without acute abnormality. Soft tissues are grossly normal. IMPRESSION: No active cardiopulmonary disease. Electronically Signed   By: Ted Mcalpine M.D.   On: 03/10/2018 12:13    Procedures Procedures (including critical care time)  Medications Ordered in ED Medications - No data to display   Initial Impression / Assessment and Plan / ED Course  I have reviewed the triage vital signs and the nursing notes.  Pertinent labs & imaging results that were available during my care of the patient were reviewed by me and considered in my medical  decision making (see chart for details).     Gloria Parsons is a 42 y.o. female with a past medical history significant for chronic headaches and hypertension who presents with anxiety, stress, and concern for elevated blood pressure and fast heart rate.  Patient reports that stresses at home have been worsening recently with her daughter causing her to get upset and had a increase in her heart rate and blood pressure.  Patient reports that when she gets worked up she had some chest tightness and use of breath.  He reports that when she is calm it goes away.  She denies history of DVT or PE.  She denies any ongoing chest pain.  She reports that when she relaxes she has no symptoms.  She denies recent injuries.  No fevers, chills, congestion, or posterior leg pain or swelling.  No recent medication changes.  She reports he does not take medicine for her blood pressure.  She denies other complaints.  On exam, lungs are clear and chest is  nontender.  No murmur.  Legs are nonedematous.  No focal neurologic deficits.  Normal gait.  Patient resting calmly.  When I began discussing patient's relationship with daughter, heart rate and blood pressure began to rise.  Heart rate jumped into the 1 10-1 20 range but quickly went back to normal.  I think patient's emotions clearly drive her vital signs.  Patient had laboratory testing which was overall reassuring.  Given patient's lack of other symptoms and her report that she is seeing her primary doctor tomorrow morning, we feel she is safe for discharge home.  As she is seeing them tomorrow, do not feel necessary to provide performing lidocaine or blood pressure medicine.  Patient's blood pressure was reassuring on reassessment in the ED.  Blood pressures were around 1 20-1 30 systolic on reassessment.  Given lack of other symptoms, and patient will appearance, patient was felt safe for discharge home.  Patient will try and reduce stress at home and avoid stimulants.  Patient will follow-up tomorrow morning as discussed.  Patient understood return precautions and was discharged in good condition with resolution of symptoms.   Final Clinical Impressions(s) / ED Diagnoses   Final diagnoses:  Anxiety  Stress at home  Tachycardia    ED Discharge Orders    None     Clinical Impression: 1. Anxiety   2. Stress at home   3. Tachycardia     Disposition: Discharge  Condition: Good  I have discussed the results, Dx and Tx plan with the pt(& family if present). He/she/they expressed understanding and agree(s) with the plan. Discharge instructions discussed at great length. Strict return precautions discussed and pt &/or family have verbalized understanding of the instructions. No further questions at time of discharge.    Discharge Medication List as of 03/10/2018  1:34 PM      Follow Up: [facility] EMERGENCY DEPARTMENT [address] [id number] mc [place] [address] [phone]    your PCP appointment tomorrow        Jaclyn Andy, Canary Brim, MD 03/11/18 [phone]

## 2018-03-10 NOTE — ED Notes (Signed)
Pt's BP and HR are now WNL.  Pt is concerned with the stress in her life increasing her BP and anxiety, specifically her 42yo daughter.  Pt has a hx of HTN, however hasn't been on BP meds for "years".  When pt is excitedly venting her HR increases and BP elevates.  After relaxation techniques and some education pt's BP and HR have improved.  Dr. Rush Landmark made aware

## 2018-03-10 NOTE — ED Notes (Signed)
Got patient undress on the monitor patient is resting with family at bedside and call bell in reach 

## 2019-09-25 IMAGING — DX DG CHEST 2V
2 series · 2 of 2 positions shown · non-contrast
Comparison: 04/02/2015

CLINICAL DATA: Shortness of breath. Tachycardia. Migraine and
nausea.

EXAM:
CHEST - 2 VIEW

[chest ap]
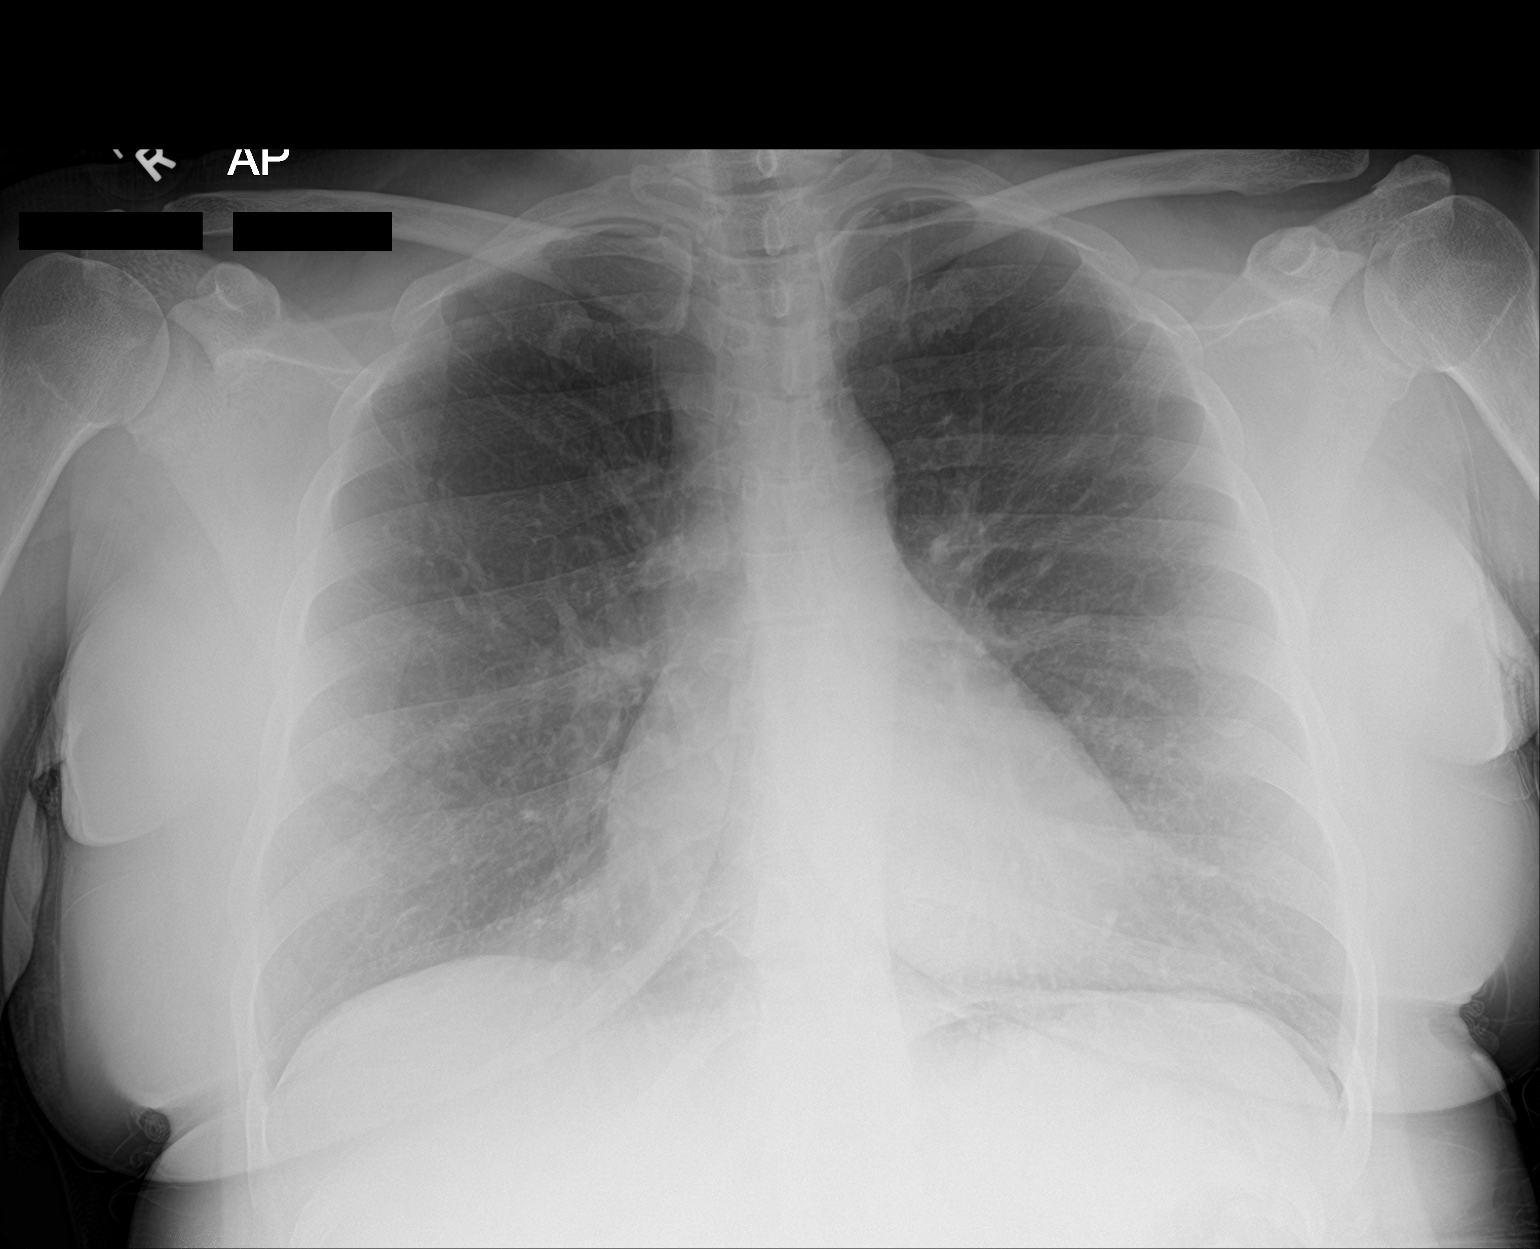

[chest lat]
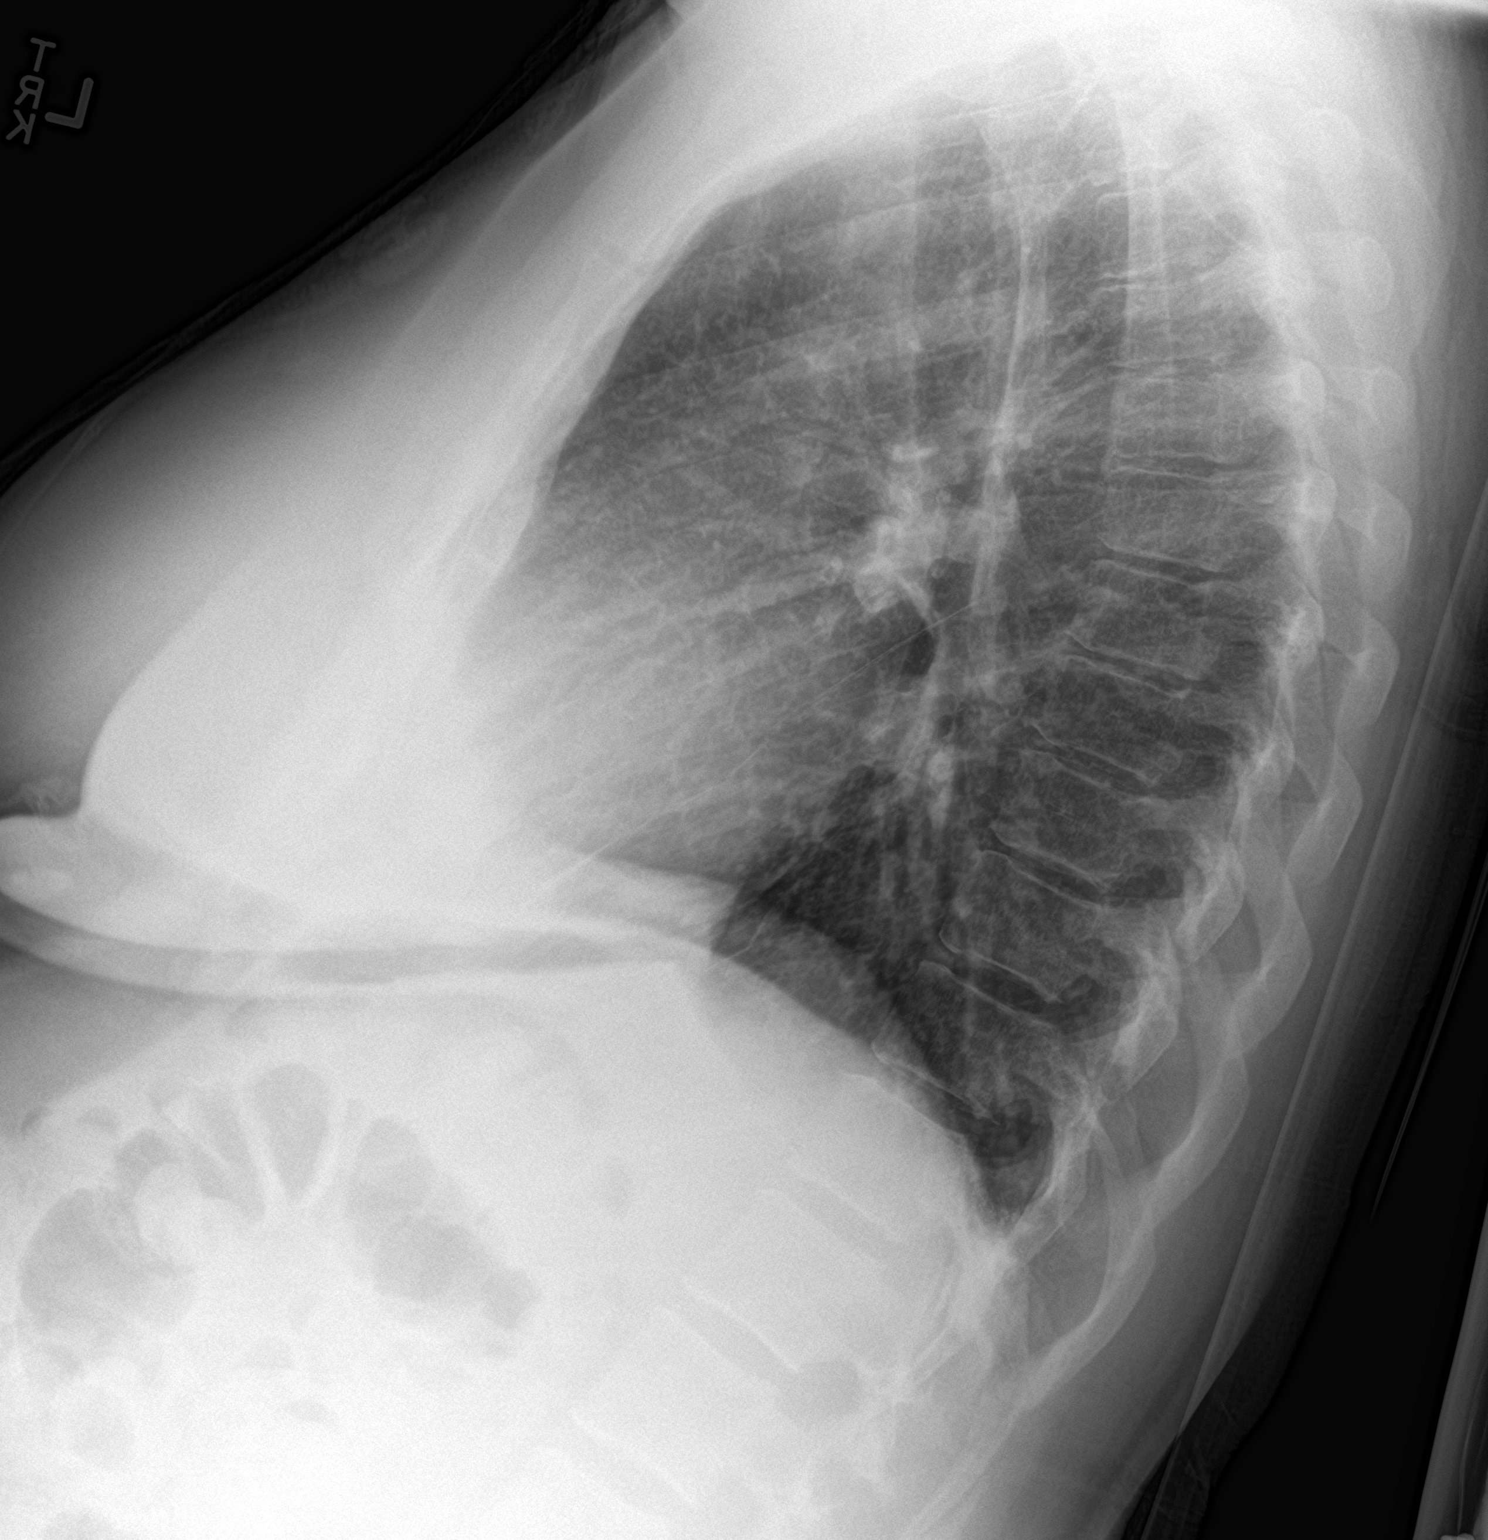

[2 of 2 positions shown; findings below may reference images not displayed]

FINDINGS: Cardiomediastinal silhouette is normal. Mediastinal contours appear
intact.

There is no evidence of focal airspace consolidation, pleural
effusion or pneumothorax.

Osseous structures are without acute abnormality. Soft tissues are
grossly normal.
IMPRESSION: No active cardiopulmonary disease.

## 2020-02-01 ENCOUNTER — Other Ambulatory Visit: Payer: Self-pay | Admitting: *Deleted

## 2020-02-01 ENCOUNTER — Other Ambulatory Visit: Payer: Self-pay

## 2020-02-01 DIAGNOSIS — Z20822 Contact with and (suspected) exposure to covid-19: Secondary | ICD-10-CM

## 2020-02-03 LAB — NOVEL CORONAVIRUS, NAA: SARS-CoV-2, NAA: NOT DETECTED

## 2020-02-03 LAB — SARS-COV-2, NAA 2 DAY TAT

## 2020-02-03 LAB — SPECIMEN STATUS REPORT

## 2020-04-17 DIAGNOSIS — U071 COVID-19: Secondary | ICD-10-CM | POA: Insufficient documentation

## 2020-04-17 HISTORY — DX: COVID-19: U07.1

## 2020-04-25 ENCOUNTER — Other Ambulatory Visit: Payer: Self-pay

## 2020-04-25 DIAGNOSIS — Z20822 Contact with and (suspected) exposure to covid-19: Secondary | ICD-10-CM

## 2020-04-27 LAB — NOVEL CORONAVIRUS, NAA: SARS-CoV-2, NAA: DETECTED — AB

## 2020-04-27 LAB — SARS-COV-2, NAA 2 DAY TAT

## 2020-04-28 ENCOUNTER — Telehealth: Payer: Self-pay | Admitting: Nurse Practitioner

## 2020-04-28 ENCOUNTER — Encounter: Payer: Self-pay | Admitting: Nurse Practitioner

## 2020-04-28 DIAGNOSIS — I10 Essential (primary) hypertension: Secondary | ICD-10-CM | POA: Insufficient documentation

## 2020-04-28 DIAGNOSIS — E663 Overweight: Secondary | ICD-10-CM | POA: Insufficient documentation

## 2020-04-28 NOTE — Telephone Encounter (Signed)
Called patient to discuss Covid symptoms and the use of casirivimab/imdevimab, a monoclonal antibody infusion for those with mild to moderate Covid symptoms and at a high risk of hospitalization.  Pt is qualified for this infusion at the [place] due to; Specific high risk criteria : BMI > 25 and Cardiovascular disease or hypertension ° ° °Message left to call back our hotline [phone]. ° °Yadira Hada, NP °  °

## 2021-02-19 ENCOUNTER — Ambulatory Visit: Payer: Self-pay | Admitting: Psychiatry

## 2021-03-05 ENCOUNTER — Ambulatory Visit: Payer: Self-pay | Admitting: Psychiatry

## 2021-04-02 ENCOUNTER — Encounter: Payer: Self-pay | Admitting: *Deleted

## 2021-04-02 ENCOUNTER — Ambulatory Visit: Payer: Self-pay | Admitting: Psychiatry

## 2022-07-13 DIAGNOSIS — I10 Essential (primary) hypertension: Secondary | ICD-10-CM | POA: Diagnosis not present

## 2022-07-13 DIAGNOSIS — R109 Unspecified abdominal pain: Secondary | ICD-10-CM | POA: Diagnosis not present

## 2022-07-13 DIAGNOSIS — M25562 Pain in left knee: Secondary | ICD-10-CM | POA: Diagnosis not present

## 2022-07-13 DIAGNOSIS — R000 Tachycardia, unspecified: Secondary | ICD-10-CM | POA: Diagnosis not present

## 2022-07-13 DIAGNOSIS — R519 Headache, unspecified: Secondary | ICD-10-CM | POA: Diagnosis not present

## 2022-07-13 DIAGNOSIS — G43909 Migraine, unspecified, not intractable, without status migrainosus: Secondary | ICD-10-CM | POA: Diagnosis not present

## 2022-07-13 DIAGNOSIS — S0990XA Unspecified injury of head, initial encounter: Secondary | ICD-10-CM | POA: Diagnosis not present

## 2022-12-09 DIAGNOSIS — M2550 Pain in unspecified joint: Secondary | ICD-10-CM | POA: Diagnosis not present

## 2022-12-09 DIAGNOSIS — R109 Unspecified abdominal pain: Secondary | ICD-10-CM | POA: Diagnosis not present

## 2022-12-09 DIAGNOSIS — I10 Essential (primary) hypertension: Secondary | ICD-10-CM | POA: Diagnosis not present

## 2022-12-09 DIAGNOSIS — G43519 Persistent migraine aura without cerebral infarction, intractable, without status migrainosus: Secondary | ICD-10-CM | POA: Diagnosis not present

## 2022-12-10 ENCOUNTER — Other Ambulatory Visit: Payer: Self-pay | Admitting: Physician Assistant

## 2022-12-10 DIAGNOSIS — G43519 Persistent migraine aura without cerebral infarction, intractable, without status migrainosus: Secondary | ICD-10-CM

## 2022-12-23 DIAGNOSIS — M2550 Pain in unspecified joint: Secondary | ICD-10-CM | POA: Diagnosis not present

## 2022-12-23 DIAGNOSIS — I10 Essential (primary) hypertension: Secondary | ICD-10-CM | POA: Diagnosis not present

## 2022-12-23 DIAGNOSIS — F411 Generalized anxiety disorder: Secondary | ICD-10-CM | POA: Diagnosis not present

## 2022-12-23 DIAGNOSIS — G43519 Persistent migraine aura without cerebral infarction, intractable, without status migrainosus: Secondary | ICD-10-CM | POA: Diagnosis not present

## 2022-12-30 ENCOUNTER — Ambulatory Visit
Admission: RE | Admit: 2022-12-30 | Discharge: 2022-12-30 | Disposition: A | Discharge status: Home / Self Care | Payer: BC Managed Care – PPO | Source: Ambulatory Visit | Attending: Physician Assistant | Admitting: Physician Assistant

## 2022-12-30 DIAGNOSIS — R42 Dizziness and giddiness: Secondary | ICD-10-CM | POA: Diagnosis not present

## 2022-12-30 DIAGNOSIS — G43519 Persistent migraine aura without cerebral infarction, intractable, without status migrainosus: Secondary | ICD-10-CM

## 2022-12-30 DIAGNOSIS — G43909 Migraine, unspecified, not intractable, without status migrainosus: Secondary | ICD-10-CM | POA: Diagnosis not present

## 2023-01-07 DIAGNOSIS — F411 Generalized anxiety disorder: Secondary | ICD-10-CM | POA: Diagnosis not present

## 2023-01-07 DIAGNOSIS — Z13220 Encounter for screening for lipoid disorders: Secondary | ICD-10-CM | POA: Diagnosis not present

## 2023-01-07 DIAGNOSIS — G43519 Persistent migraine aura without cerebral infarction, intractable, without status migrainosus: Secondary | ICD-10-CM | POA: Diagnosis not present

## 2023-01-07 DIAGNOSIS — I10 Essential (primary) hypertension: Secondary | ICD-10-CM | POA: Diagnosis not present

## 2023-01-07 DIAGNOSIS — Z0000 Encounter for general adult medical examination without abnormal findings: Secondary | ICD-10-CM | POA: Diagnosis not present

## 2023-04-27 ENCOUNTER — Ambulatory Visit: Payer: BC Managed Care – PPO | Admitting: Diagnostic Neuroimaging

## 2024-02-07 ENCOUNTER — Ambulatory Visit: Payer: Self-pay

## 2024-02-07 NOTE — Telephone Encounter (Signed)
 Copied from CRM [id number]. Topic: Clinical - Red Word Triage >> Feb 07, 2024  9:41 AM Alfonso ORN wrote:  Red Word that prompted transfer to Nurse Triage:   pt been dealing with for 30 years , getting worse  suffer with chronic miagraines , right now the symptoms are lingering can feel the its approaching  , get severe  headaches make patient real sick , throbbing behind eyes , nausea , dizzness  pt. need  to be establish care with a new provider in [place]     ----------------------------------------------------------------------- From previous Reason for Contact - Scheduling: Patient/patient representative is calling to schedule an appointment. Refer to attachments for appointment information. Reason for Disposition  Similar to previously diagnosed migraine headaches  Answer Assessment - Initial Assessment Questions Additional info: Patient called to schedule appointment to establish care. She has a history of migraines. Currently mild headache. New patient appointment scheduled to establish care. Discussed reasons to seek evaluation at urgent care/ER.    1. LOCATION: Where does it hurt?      Behind eyes 2. ONSET: When did the headache start? (e.g., minutes, hours, days)      Few hours 3. PATTERN: Does the pain come and go, or has it been constant since it started?     Intermittent X 30 years  4. SEVERITY: How bad is the pain? and What does it keep you from doing?  (e.g., Scale 1-10; mild, moderate, or severe)     Mild 5. RECURRENT SYMPTOM: Have you ever had headaches before? If Yes, ask: When was the last time? and What happened that time?      yes 6. CAUSE: What do you think is causing the headache?     Hasn't eaten today 7. MIGRAINE: Have you been diagnosed with migraine headaches? If Yes, ask: Is this headache similar?      yes 8. HEAD INJURY: Has there been any recent injury to your head?      no 9. OTHER SYMPTOMS: Do you have any other symptoms?  (e.g., fever, stiff neck, eye pain, sore throat, cold symptoms)     Usual symptoms Nausea, dizziness-asymptomatic at this time  Protocols used: Headache-A-AH

## 2024-02-25 ENCOUNTER — Ambulatory Visit: Admitting: Student in an Organized Health Care Education/Training Program

## 2024-03-14 DIAGNOSIS — F411 Generalized anxiety disorder: Secondary | ICD-10-CM | POA: Insufficient documentation

## 2024-03-15 ENCOUNTER — Encounter: Payer: Self-pay | Admitting: Student in an Organized Health Care Education/Training Program

## 2024-03-15 ENCOUNTER — Ambulatory Visit: Admitting: Student in an Organized Health Care Education/Training Program

## 2024-03-15 VITALS — BP 149/81 | HR 67 | Ht 64.0 in | Wt 166.0 lb

## 2024-03-15 DIAGNOSIS — Z131 Encounter for screening for diabetes mellitus: Secondary | ICD-10-CM | POA: Diagnosis not present

## 2024-03-15 DIAGNOSIS — G43E09 Chronic migraine with aura, not intractable, without status migrainosus: Secondary | ICD-10-CM | POA: Diagnosis not present

## 2024-03-15 DIAGNOSIS — G43909 Migraine, unspecified, not intractable, without status migrainosus: Secondary | ICD-10-CM | POA: Insufficient documentation

## 2024-03-15 DIAGNOSIS — F411 Generalized anxiety disorder: Secondary | ICD-10-CM | POA: Diagnosis not present

## 2024-03-15 DIAGNOSIS — I10 Essential (primary) hypertension: Secondary | ICD-10-CM

## 2024-03-15 DIAGNOSIS — Z13220 Encounter for screening for lipoid disorders: Secondary | ICD-10-CM | POA: Diagnosis not present

## 2024-03-15 DIAGNOSIS — Z114 Encounter for screening for human immunodeficiency virus [HIV]: Secondary | ICD-10-CM

## 2024-03-15 DIAGNOSIS — Z1159 Encounter for screening for other viral diseases: Secondary | ICD-10-CM

## 2024-03-15 LAB — BASIC METABOLIC PANEL WITH GFR
BUN: 10 mg/dL (ref 6–23)
CO2: 29 meq/L (ref 19–32)
Calcium: 9.7 mg/dL (ref 8.4–10.5)
Chloride: 104 meq/L (ref 96–112)
Creatinine, Ser: 0.72 mg/dL (ref 0.40–1.20)
GFR: 99.14 mL/min (ref >60.00)
Glucose, Bld: 105 mg/dL — ABNORMAL HIGH (ref 70–99)
Potassium: 3.8 meq/L (ref 3.5–5.1)
Sodium: 140 meq/L (ref 135–145)

## 2024-03-15 LAB — MICROALBUMIN / CREATININE URINE RATIO
Creatinine,U: 47.9 mg/dL
Microalb Creat Ratio: UNDETERMINED mg/g (ref 0.0–30.0)
Microalb, Ur: <0.7 mg/dL

## 2024-03-15 LAB — LIPID PANEL
Cholesterol: 194 mg/dL (ref 0–200)
HDL: 57.7 mg/dL (ref >39.00)
LDL Cholesterol: 103 mg/dL — ABNORMAL HIGH (ref 0–99)
NonHDL: 136.46
Total CHOL/HDL Ratio: 3
Triglycerides: 166 mg/dL — ABNORMAL HIGH (ref 0.0–149.0)
VLDL: 33.2 mg/dL (ref 0.0–40.0)

## 2024-03-15 LAB — HEMOGLOBIN A1C: Hgb A1c MFr Bld: 5.3 % (ref 4.6–6.5)

## 2024-03-15 MED ORDER — SUMATRIPTAN SUCCINATE 50 MG PO TABS: 50.0000 mg | ORAL_TABLET | ORAL | 2 refills | Status: DC | PRN

## 2024-03-15 MED ORDER — LOSARTAN POTASSIUM-HCTZ 50-12.5 MG PO TABS: 1.0000 | ORAL_TABLET | Freq: Every day | ORAL | 2 refills | Status: DC

## 2024-03-15 NOTE — Assessment & Plan Note (Signed)
 Chronic asymptomatic stage II hypertension, not currently on treatment.  Patient had sensitivities in the past to medications like lisinopril, but no allergies.. Hyzaar is initiated to manage blood pressure and reduce cardiovascular risk. Prescribed Hyzaar (losartan and hydrochlorothiazide) once daily in the morning. Monitor for orthostatic hypotension and recheck blood pressure in one month.

## 2024-03-15 NOTE — Progress Notes (Signed)
 New Patient Office Visit  Patient ID: Gloria Parsons, Female   DOB: 1976/01/02 48 y.o. MRN: [id number]  Chief Complaint  Patient presents with   Establish Care    Chronic Migraines  Hypertension  Perimenopause    Subjective:     Gloria Parsons presents to establish care  HPI  Discussed the use of AI scribe software for clinical note transcription with the patient, who gave verbal consent to proceed.  History of Present Illness Gloria Parsons is a 48 year old female with chronic migraines who presents for re-establishment of care and management of her migraines.  She has experienced chronic migraines since the age of 75, occurring 4 to 5 days a week, with some periods of daily occurrence. Symptoms include nausea, pain behind the eyes, shoulder and neck pain, and sensitivity to light. She sometimes experiences auras and keeps her apartment dark to manage light sensitivity. Various medications, including Nurtec, Topamax, propranolol, and Qelbree, have been tried without significant relief. She is hesitant to try injectable treatments for migraines. Over-the-counter medications like Tylenol  and ibuprofen are used occasionally.  She has a history of high blood pressure, initially noted during her pregnancies. She has been on various blood pressure medications, including lisinopril, but is not currently on any prescribed medication for it.  Her stress levels are high due to personal and family issues, including managing her children's needs and her daughter's behavioral health problems. She works part-time in physicist, medical and has previously lost a job due to her migraines. She reports high stress from work and family responsibilities, which she believes exacerbates her migraines.  She sleeps approximately 6 to 7 hours per night, though her sleep schedule can vary. She maintains a healthy diet and has lost weight recently. She engages in physical activity by walking and occasionally  jogging, and she feels better on days when she exercises.  She experiences symptoms of perimenopause, including hot flashes and night sweats. She also deals with anxiety and depression, though she remains functional and manages her daily responsibilities. She has tried antidepressants in the past but experienced adverse reactions.  Her family history includes diabetes and mental health issues, with her mother having had behavioral health problems. Her sister passed away from brain cancer three years ago. She has had three cesarean sections and minor surgeries in the past, but no recent hospital admissions or major illnesses.   Outpatient Encounter Medications as of 03/15/2024  Medication Sig   losartan-hydrochlorothiazide (HYZAAR) 50-12.5 MG tablet Take 1 tablet by mouth daily.   SUMAtriptan (IMITREX) 50 MG tablet Take 1 tablet (50 mg total) by mouth every 2 (two) hours as needed for migraine. May repeat in 2 hours if headache persists or recurs.   [DISCONTINUED] HYDROcodone -acetaminophen  (NORCO/VICODIN) 5-325 MG per tablet Take 1 tablet by mouth every 4 (four) hours as needed. (Patient not taking: Reported on 03/15/2024)   [DISCONTINUED] Multiple Vitamin (MULTIVITAMIN WITH MINERALS) TABS tablet Take 1 tablet by mouth daily. (Patient not taking: Reported on 03/15/2024)   [DISCONTINUED] Prenatal Vit-Fe Fumarate-FA (PRENATAL VITAMINS) 28-0.8 MG TABS Take 1 tablet by mouth daily. (Patient not taking: Reported on 05/08/2014)   No facility-administered encounter medications on file as of 03/15/2024.    Past Medical History:  Diagnosis Date   COVID-19 virus infection 04/2020   Headache(784.0)    Hypertension    Migraines    Overweight (BMI 25.0-29.9)    Seasonal allergies     Past Surgical History:  Procedure Laterality Date  CESAREAN SECTION     FINGER SURGERY     INDUCED ABORTION     SKIN GRAFT     TUBAL LIGATION      Family History  Problem Relation Age of Onset   Mental illness  Mother    Alcohol abuse Mother    Arthritis Mother    Depression Mother    Diabetes Mother    Hyperlipidemia Mother    Hypertension Mother    Miscarriages / Stillbirths Mother    Alcohol abuse Father    Arthritis Father    Hypertension Father    Hyperlipidemia Father    Stroke Father    Hypertension Sister    Hypertension Sister    Hypertension Brother    Diabetes Brother    Asthma Daughter    Asthma Son    Heart attack Maternal Grandmother    Arthritis Maternal Grandmother    Hearing loss Maternal Grandmother    Hyperlipidemia Maternal Grandmother    Hypertension Maternal Grandmother    Stroke Maternal Grandmother    Alcohol abuse Maternal Grandfather    Kidney disease Maternal Grandfather    Other Neg Hx       Objective:    BP (!) 149/81   Pulse 67   Ht 5' 4 (1.626 m)   Wt 166 lb (75.3 kg)   SpO2 100%   BMI 28.49 kg/m   Physical Exam  Gen: Well-appearing woman Eyes: Normal Ears: Normal hearing, normal tympanic membranes Mouth: Normal oropharynx Neck: Normal thyroid , no nodules or adenopathy Heart: Regular, no murmur Lungs: Unlabored, clear throughout Abd: Soft, nontender, no organomegaly, no hernia Ext: Warm, no edema, normal joints Neuro: Alert, conversational, full strength upper and lower extremities, normal get up and go, normal gait and balance, normal cranial nerves Psych: Moderately anxious appearing, moderately pressured speech, not depressed appearing, thoughts are organized and linear      Assessment & Plan:   Problem List Items Addressed This Visit       High   Hypertension - Primary (Chronic)   Chronic asymptomatic stage II hypertension, not currently on treatment.  Patient had sensitivities in the past to medications like lisinopril, but no allergies.. Hyzaar is initiated to manage blood pressure and reduce cardiovascular risk. Prescribed Hyzaar (losartan and hydrochlorothiazide) once daily in the morning. Monitor for orthostatic  hypotension and recheck blood pressure in one month.      Relevant Medications   losartan-hydrochlorothiazide (HYZAAR) 50-12.5 MG tablet   Other Relevant Orders   Basic metabolic panel with GFR   Microalbumin / creatinine urine ratio   Migraine (Chronic)   Chronic migraines persist with limited response to previous treatments.  Neurologic exam today is normal, no red flag or high risk symptoms.  Previous MRI about 2 years ago was reportedly reassuring.  Based on exam, I think very low risk for structural disease.  Non-pharmacological strategies and medication options were discussed; I recommended improved sleep, reduce stress, stay hydrated, healthy nutrition, and daily exercise.  She requested a referral to neurology, which I think is reasonable.  Previous medication management with propranolol and topiramate has not been effective.  I offered her CGRP inhibitor like Ajovy or Emgality, but she declines because she wants to avoid injectable medications.  Prescribed Imitrex for acute migraine attacks, with one tablet at onset and a second two hours later if needed.  Patient has no history of ischemic vascular disease.  I cautioned that I do not think medication management alone will work if the other  issues were related to mood, stress, and lifestyle or not addressed.      Relevant Medications   losartan-hydrochlorothiazide (HYZAAR) 50-12.5 MG tablet   SUMAtriptan (IMITREX) 50 MG tablet   Other Relevant Orders   Ambulatory referral to Neurology     Medium    Generalized anxiety disorder (Chronic)   Functional depression and anxiety are linked to stressors, with previous antidepressants poorly tolerated. Stress management strategies were discussed.  Patient declined medication assisted treatment.      Other Visit Diagnoses       Screening for lipid disorders       Relevant Orders   Lipid panel     Screening for diabetes mellitus       Relevant Orders   Hemoglobin A1c     Encounter for  HCV screening test for low risk patient       Relevant Orders   Hepatitis C antibody     Screening for HIV (human immunodeficiency virus)       Relevant Orders   HIV Antibody (routine testing w rflx)       Return in about 4 weeks (around 04/12/2024) for HTN management.   Cleatus Debby Specking, MD [facility]

## 2024-03-15 NOTE — Assessment & Plan Note (Addendum)
 Chronic migraines persist with limited response to previous treatments.  Neurologic exam today is normal, no red flag or high risk symptoms.  Previous MRI about 2 years ago was reportedly reassuring.  Based on exam, I think very low risk for structural disease.  Non-pharmacological strategies and medication options were discussed; I recommended improved sleep, reduce stress, stay hydrated, healthy nutrition, and daily exercise.  She requested a referral to neurology, which I think is reasonable.  Previous medication management with propranolol and topiramate has not been effective.  I offered her CGRP inhibitor like Ajovy or Emgality, but she declines because she wants to avoid injectable medications.  Prescribed Imitrex for acute migraine attacks, with one tablet at onset and a second two hours later if needed.  Patient has no history of ischemic vascular disease.  I cautioned that I do not think medication management alone will work if the other issues were related to mood, stress, and lifestyle or not addressed.

## 2024-03-15 NOTE — Assessment & Plan Note (Signed)
 Functional depression and anxiety are linked to stressors, with previous antidepressants poorly tolerated. Stress management strategies were discussed.  Patient declined medication assisted treatment.

## 2024-03-15 NOTE — Patient Instructions (Signed)
  VISIT SUMMARY: Today, we discussed your chronic migraines, high blood pressure, depression and anxiety, and perimenopausal symptoms. We reviewed your history and current symptoms, and we have made some adjustments to your treatment plan to help manage these conditions more effectively.  YOUR PLAN: -CHRONIC MIGRAINE: Chronic migraines are severe headaches that occur frequently and can be debilitating. We discussed non-drug strategies and medication options. I have prescribed Imitrex for acute migraine attacks; take one tablet at the onset of a migraine and a second tablet two hours later if needed. I am also referring you to a neurologist for further evaluation and management, including the possibility of an MRI.  -ESSENTIAL HYPERTENSION: Essential hypertension is high blood pressure with no identifiable cause. To manage your blood pressure and reduce cardiovascular risk, I have prescribed Hyzaar (a combination of losartan and hydrochlorothiazide) to be taken once daily in the morning. Please monitor for any dizziness when standing up and recheck your blood pressure in one month.  -DEPRESSION AND ANXIETY: Depression and anxiety are mental health conditions that can affect your mood and daily functioning. We discussed various stress management strategies to help you cope with these issues. Since you have had poor tolerance to antidepressants in the past, we will focus on non-drug approaches for now.  -PERIMENOPAUSAL SYMPTOMS: Perimenopause is the transition period before menopause when you may experience symptoms like hot flashes and night sweats. We will continue to monitor these symptoms and discuss any further treatment options if needed.  -GENERAL HEALTH MAINTENANCE: We will monitor your cholesterol, blood sugar, and kidney function. I have ordered blood work to check these levels and a urine sample to check for protein.  INSTRUCTIONS: Please follow up with the neurology referral for further  evaluation of your migraines. Recheck your blood pressure in one month. Complete the blood work and urine sample as discussed. If you experience any new or worsening symptoms, please contact our office.

## 2024-03-16 ENCOUNTER — Ambulatory Visit: Payer: Self-pay | Admitting: Student in an Organized Health Care Education/Training Program

## 2024-03-16 LAB — HEPATITIS C ANTIBODY: Hepatitis C Ab: NONREACTIVE

## 2024-03-16 LAB — HIV ANTIBODY (ROUTINE TESTING W REFLEX)
HIV 1&2 Ab, 4th Generation: NONREACTIVE
HIV FINAL INTERPRETATION: NEGATIVE

## 2024-03-30 ENCOUNTER — Other Ambulatory Visit: Payer: Self-pay

## 2024-03-30 ENCOUNTER — Emergency Department (HOSPITAL_COMMUNITY)
Admission: EM | Admit: 2024-03-30 | Discharge: 2024-03-30 | Disposition: A | Discharge status: Home / Self Care | Attending: Emergency Medicine | Admitting: Emergency Medicine

## 2024-03-30 DIAGNOSIS — R519 Headache, unspecified: Secondary | ICD-10-CM | POA: Insufficient documentation

## 2024-03-30 DIAGNOSIS — N951 Menopausal and female climacteric states: Secondary | ICD-10-CM | POA: Diagnosis not present

## 2024-03-30 DIAGNOSIS — K0889 Other specified disorders of teeth and supporting structures: Secondary | ICD-10-CM | POA: Diagnosis not present

## 2024-03-30 DIAGNOSIS — Z91040 Latex allergy status: Secondary | ICD-10-CM | POA: Insufficient documentation

## 2024-03-30 DIAGNOSIS — G8929 Other chronic pain: Secondary | ICD-10-CM | POA: Insufficient documentation

## 2024-03-30 LAB — CBC WITH DIFFERENTIAL/PLATELET
Abs Immature Granulocytes: 0.02 K/uL (ref 0.00–0.07)
Basophils Absolute: 0.1 K/uL (ref 0.0–0.1)
Basophils Relative: 1 %
Eosinophils Absolute: 0.1 K/uL (ref 0.0–0.5)
Eosinophils Relative: 1 %
HCT: 43.5 % (ref 36.0–46.0)
Hemoglobin: 15 g/dL (ref 12.0–15.0)
Immature Granulocytes: 0 %
Lymphocytes Relative: 18 %
Lymphs Abs: 1.4 K/uL (ref 0.7–4.0)
MCH: 31.9 pg (ref 26.0–34.0)
MCHC: 34.5 g/dL (ref 30.0–36.0)
MCV: 92.6 fL (ref 80.0–100.0)
Monocytes Absolute: 0.4 K/uL (ref 0.1–1.0)
Monocytes Relative: 5 %
Neutro Abs: 5.9 K/uL (ref 1.7–7.7)
Neutrophils Relative %: 75 %
Platelets: 355 K/uL (ref 150–400)
RBC: 4.7 MIL/uL (ref 3.87–5.11)
RDW: 12.4 % (ref 11.5–15.5)
WBC: 7.9 K/uL (ref 4.0–10.5)
nRBC: 0 % (ref 0.0–0.2)

## 2024-03-30 LAB — URINALYSIS, ROUTINE W REFLEX MICROSCOPIC
Bilirubin Urine: NEGATIVE
Glucose, UA: NEGATIVE mg/dL
Ketones, ur: NEGATIVE mg/dL
Nitrite: NEGATIVE
Protein, ur: NEGATIVE mg/dL
Specific Gravity, Urine: 1.006 (ref 1.005–1.030)
pH: 6 (ref 5.0–8.0)

## 2024-03-30 LAB — COMPREHENSIVE METABOLIC PANEL WITH GFR
ALT: 12 U/L (ref 0–44)
AST: 17 U/L (ref 15–41)
Albumin: 4.2 g/dL (ref 3.5–5.0)
Alkaline Phosphatase: 75 U/L (ref 38–126)
Anion gap: 10 (ref 5–15)
BUN: 10 mg/dL (ref 6–20)
CO2: 24 mmol/L (ref 22–32)
Calcium: 9.1 mg/dL (ref 8.9–10.3)
Chloride: 106 mmol/L (ref 98–111)
Creatinine, Ser: 0.81 mg/dL (ref 0.44–1.00)
GFR, Estimated: >60 mL/min (ref >60)
Glucose, Bld: 98 mg/dL (ref 70–99)
Potassium: 3.6 mmol/L (ref 3.5–5.1)
Sodium: 140 mmol/L (ref 135–145)
Total Bilirubin: 0.9 mg/dL (ref 0.0–1.2)
Total Protein: 7.9 g/dL (ref 6.5–8.1)

## 2024-03-30 MED ORDER — NAPROXEN 500 MG PO TABS: 500.0000 mg | ORAL_TABLET | Freq: Two times a day (BID) | ORAL | 0 refills | Status: AC

## 2024-03-30 MED ORDER — LACTATED RINGERS IV BOLUS
1000.0000 mL | Freq: Once | INTRAVENOUS | Status: AC
  Administered 2024-03-30: 1000 mL via INTRAVENOUS

## 2024-03-30 MED ORDER — ACETAMINOPHEN 500 MG PO TABS
1000.0000 mg | ORAL_TABLET | Freq: Once | ORAL | Status: AC
  Administered 2024-03-30: 1000 mg via ORAL
  Filled 2024-03-30: qty 2

## 2024-03-30 MED ORDER — PROCHLORPERAZINE EDISYLATE 10 MG/2ML IJ SOLN
5.0000 mg | Freq: Once | INTRAMUSCULAR | Status: AC
  Administered 2024-03-30: 5 mg via INTRAVENOUS
  Filled 2024-03-30: qty 2

## 2024-03-30 MED ORDER — DEXAMETHASONE SOD PHOSPHATE PF 10 MG/ML IJ SOLN
10.0000 mg | Freq: Once | INTRAMUSCULAR | Status: AC
  Administered 2024-03-30: 10 mg via INTRAVENOUS

## 2024-03-30 MED ORDER — OXYCODONE HCL 5 MG PO TABS
5.0000 mg | ORAL_TABLET | Freq: Once | ORAL | Status: AC
  Administered 2024-03-30: 5 mg via ORAL
  Filled 2024-03-30: qty 1

## 2024-03-30 MED ORDER — KETOROLAC TROMETHAMINE 15 MG/ML IJ SOLN
15.0000 mg | Freq: Once | INTRAMUSCULAR | Status: AC
  Administered 2024-03-30: 15 mg via INTRAVENOUS
  Filled 2024-03-30: qty 1

## 2024-03-30 NOTE — ED Notes (Signed)
 Paper work reviewed with pt, work excuses given. No questions or concerns at this time. Pt is leaving for home with family.

## 2024-03-30 NOTE — ED Provider Notes (Signed)
 [facility] Provider Note   CSN: [id number] Arrival date & time: 03/30/24  9085     History Chief Complaint  Patient presents with   Migraine   Dental Pain    HPI: Gloria Parsons is a 48 y.o. female with history pertinent for migraines, elevated BMI, anxiety, chronic tooth pain who presents complaining of persistent tooth pain and migraine. Patient arrived via POV accompanied by spouse and 2 children.  History provided by patient.  No interpreter required during this encounter.  Patient reports that she has had several months of worsening tooth pain, reports that she still has her wisdom teeth, and is having pain from this.  Reports that she is scheduled to see a oral surgeon next month to have these teeth removed.  Reports that she also has a history of migraine headaches, and currently she is having a right-sided headache that is consistent with her prior migraine headaches, however given she has also had progression of her dental pain, she became concerned that she could have a dental infection and was worried that this could be contributing to her headache, therefore came to the emergency department for further evaluation.  Reports that that she is also perimenopausal and is experiencing intermittent hot flashes which are baseline.  Nuys fever, chills, chest pain, shortness of breath, diarrhea.  Patient's recorded medical, surgical, social, medication list and allergies were reviewed in the Snapshot window as part of the initial history.   Prior to Admission medications   Medication Sig Start Date End Date Taking? Authorizing Provider  naproxen (NAPROSYN) 500 MG tablet Take 1 tablet (500 mg total) by mouth 2 (two) times daily for 5 days. 03/30/24 04/04/24 Yes Gloria Jerilynn GORMAN, MD  losartan-hydrochlorothiazide (HYZAAR) 50-12.5 MG tablet Take 1 tablet by mouth daily. 03/15/24   Jerrell Cleatus Ned, MD  SUMAtriptan (IMITREX) 50 MG  tablet Take 1 tablet (50 mg total) by mouth every 2 (two) hours as needed for migraine. May repeat in 2 hours if headache persists or recurs. 03/15/24   Jerrell Cleatus Ned, MD     Allergies: Latex, Zofran [ondansetron hcl], and Benadryl [diphenhydramine hcl]   Review of Systems   ROS as per HPI  Physical Exam Updated Vital Signs BP 128/74   Pulse 97   Temp 98.1 F (36.7 C)   Resp 16   LMP 02/28/2024   SpO2 100%  Physical Exam Vitals and nursing note reviewed.  Constitutional:      General: She is not in acute distress.    Appearance: She is well-developed.  HENT:     Head: Normocephalic and atraumatic.     Mouth/Throat:     Comments: No overt dental caries on oropharyngeal examination, no gingival erythema, fluctuance, overt dental abscess or tenderness to percussion Eyes:     Conjunctiva/sclera: Conjunctivae normal.  Cardiovascular:     Rate and Rhythm: Normal rate and regular rhythm.     Heart sounds: No murmur heard. Pulmonary:     Effort: Pulmonary effort is normal. No respiratory distress.     Breath sounds: Normal breath sounds.  Abdominal:     Palpations: Abdomen is soft.     Tenderness: There is no abdominal tenderness.  Musculoskeletal:        General: No swelling.     Cervical back: Neck supple.  Skin:    General: Skin is warm and dry.     Capillary Refill: Capillary refill takes less than 2 seconds.  Neurological:  Mental Status: She is alert.  Psychiatric:        Mood and Affect: Mood normal.     ED Course/ Medical Decision Making/ A&P    Procedures Procedures   Medications Ordered in ED Medications  ketorolac  (TORADOL ) 15 MG/ML injection 15 mg (15 mg Intravenous Given 03/30/24 1304)  oxyCODONE (Oxy IR/ROXICODONE) immediate release tablet 5 mg (5 mg Oral Given 03/30/24 1306)  acetaminophen  (TYLENOL ) tablet 1,000 mg (1,000 mg Oral Given 03/30/24 1306)  lactated ringers bolus 1,000 mL (0 mLs Intravenous Stopping previously hung infusion  03/30/24 1454)  dexamethasone (DECADRON) injection 10 mg (10 mg Intravenous Given 03/30/24 1306)  prochlorperazine (COMPAZINE) injection 5 mg (5 mg Intravenous Given 03/30/24 1305)    Medical Decision Making:   Gloria Parsons is a 48 y.o. female who presents for tooth pain and headache as per above.  Physical exam is pertinent for no appreciable dental abscess, gingival changes..   The differential includes but is not limited to dental infection, teeth crowding from wisdom teeth presents, gingival cellulitis, dental abscess, ICH, migraine, tension headache.  Independent historian: None  External data reviewed: No pertinent external data  Initial Plan:  Screening labs including CBC and Metabolic panel to evaluate for infectious or metabolic etiology of disease.  Urinalysis with reflex culture ordered to evaluate for UTI or relevant urologic/nephrologic pathology.   Labs: Ordered, Independent interpretation, and Details: UA without UTI.  CBC without leukocytosis, anemia, thrombocytopenia.  CMP without AKI, emergent electrolyte derangement, emergent LFT abnormality  Radiology: Not indicated No results found.  EKG/Medicine tests: Not indicated EKG Interpretation:    Interventions: Toradol , Compazine, oxycodone, Decadron, Tylenol , LR bolus  See the EMR for full details regarding lab and imaging results.  Patient presents to the emergency department for both teeth pain as well as headache, patient has had similar headaches, is not sure if her 2 complaints are related.  Patient does not have evidence of dental abscess, gingival cellulitis or abscess on exam.  Patient has history of dental crowding due to wisdom teeth, given no appreciable oropharyngeal abnormality, feel that patient's pain is most likely due to her known dental crowding, will treat with multimodal pain therapy and recommended close follow-up with oral surgeon with whom she has a appointment scheduled in the coming weeks.   Lack of fever and leukocytosis also make infectious etiology of symptoms unlikely.  With regard to patient's headache, patient does not have any focal neurologic deficit on exam, headache is similar to prior migraines, does not have thunderclap headache, no recent trauma or falls, therefore low risk for ICH, do not feel that patient warrants CT of the head at this time.  Will treat with multimodal migraine cocktail including oxycodone to treat dental component of patient's pain.  Did discuss risks and benefits of NSAIDs and these are harmful if there is any possibility of pregnancy due to risk to fetal kidneys, patient denies possibility of pregnancy, reports that she is status post remote tubal ligation, declines pregnancy test at this time, given patient is understanding for the risks, I feel that this is appropriate.  On reevaluation after multimodal pain therapy patient has resolution of her headache as well as significant improvement in her dental pain, patient is comfortable with plan for follow-up with her PCP and outpatient oral surgeon.  Presentation is most consistent with acute complicated illness and Current presentation is complicated by underlying chronic conditions  Discussion of management or test interpretations with external provider(s): Not indicated  Risk Drugs:OTC  drugs and Prescription drug management  Disposition: DISCHARGE: I believe that the patient is safe for discharge home with outpatient follow-up. Patient was informed of all pertinent physical exam, laboratory, and imaging findings.  Patient's suspected etiology of their symptom presentation was discussed with the patient and all questions were answered. We discussed following up with PCP. I provided thorough ED return precautions. The patient feels safe and comfortable with this plan.  MDM generated using voice dictation software and may contain dictation errors.  Please contact me for any clarification or with any  questions.  Clinical Impression:  1. Bad headache   2. Chronic dental pain      Discharge   Final Clinical Impression(s) / ED Diagnoses Final diagnoses:  Bad headache  Chronic dental pain    Rx / DC Orders ED Discharge Orders          Ordered    naproxen (NAPROSYN) 500 MG tablet  2 times daily        03/30/24 1447             Gloria Jerilynn RAMAN, MD 04/01/24 1521

## 2024-03-30 NOTE — ED Triage Notes (Signed)
 Pt arrived POV c/o feeling lightheaded, dizzy, having hot flashes and a migraine.Pt has been having some dental pain has an appointment for oral surgeon next month.Rates pain 8/10 Hx of migraines

## 2024-03-30 NOTE — Discharge Instructions (Signed)
 Delon GORMAN Sprung  Thank you for allowing us  to take care of you today.  You came to the Emergency Department today because you had one of your typical migraine headaches in the setting of your chronic dental pain, and you are worried that your tooth may have gotten infected and that might be contributing to your headache.  We are not seeing any signs of infection of your teeth on exam, and you do not have a fever and your white blood cell count is not elevated, therefore we do not think that you are symptoms are due to a infection.  Your headache is improved after getting some medication.  For your tooth pain, we recommend taking naproxen twice a day for the next 5 days, not take any other NSAIDs (ibuprofen, Aleve, Advil) while taking naproxen as these are the same medications and you could potentially overdose on NSAIDs if you did this.  You can take Tylenol  at home as needed.  It is very important that you follow-up with your oral surgeon for definitive management.  To-Do: 1. Please follow-up with your primary doctor within 1 - 2 weeks / as soon as possible.   Please return to the Emergency Department or call 911 if you experience have worsening of your symptoms, or do not get better, chest pain, shortness of breath, severe or significantly worsening pain, high fever, severe confusion, pass out or have any reason to think that you need emergency medical care.   We hope you feel better soon.   Mitzie Later, MD Department of Emergency Medicine [facility] [place]

## 2024-04-18 ENCOUNTER — Ambulatory Visit: Admitting: Student in an Organized Health Care Education/Training Program

## 2024-04-25 ENCOUNTER — Telehealth: Payer: Self-pay | Admitting: Student in an Organized Health Care Education/Training Program

## 2024-04-25 ENCOUNTER — Ambulatory Visit: Admitting: Student in an Organized Health Care Education/Training Program

## 2024-04-25 MED ORDER — LORAZEPAM 0.5 MG PO TABS: 0.5000 mg | ORAL_TABLET | Freq: Two times a day (BID) | ORAL | 0 refills | Status: AC | PRN

## 2024-04-25 NOTE — Addendum Note (Signed)
 Addended by: JERRELL SOLIAN T on: 04/25/2024 11:28 AM   Modules accepted: Orders

## 2024-04-25 NOTE — Telephone Encounter (Signed)
 I spoke with the patient by phone.  Going through a lot right now.  3 days ago her 48 year old daughter died by suicide.  She is feeling overwhelmed, anxious, crying.  We talked about acute grief, this will be a process.  I offered her supportive care with oral Ativan  0.5 mg to be used up to 2 times daily for 1 week.  I gave her warnings about this medication, could cause sedation, do not mix with alcohol.  Patient denies any thoughts of hurting herself, she has 2 other children that she is supporting.  Patient is to call me back in about a week to update me on how she is doing.

## 2024-04-25 NOTE — Telephone Encounter (Signed)
 Patient is requesting support with grief counseling and any other services that we could provide for her   Copied from CRM #[id number]. Topic: General - Other >> Apr 25, 2024  9:40 AM Vena HERO wrote:  Reason for CRM: Pt found daughter deceased at her home on 2024-05-16 and rescheduled her appt today for 05/29/24. She is requesting any support we may be able to provide for grief counseling, depression and anxiety to help her navigate this. Please call pt to advise

## 2024-05-29 ENCOUNTER — Encounter: Payer: Self-pay | Admitting: Student in an Organized Health Care Education/Training Program

## 2024-05-29 ENCOUNTER — Ambulatory Visit (INDEPENDENT_AMBULATORY_CARE_PROVIDER_SITE_OTHER): Admitting: Student in an Organized Health Care Education/Training Program

## 2024-05-29 VITALS — BP 144/83 | HR 85 | Temp 98.2°F | Ht 63.0 in | Wt 172.0 lb

## 2024-05-29 DIAGNOSIS — G43E09 Chronic migraine with aura, not intractable, without status migrainosus: Secondary | ICD-10-CM | POA: Diagnosis not present

## 2024-05-29 DIAGNOSIS — F4320 Adjustment disorder, unspecified: Secondary | ICD-10-CM | POA: Diagnosis not present

## 2024-05-29 DIAGNOSIS — I10 Essential (primary) hypertension: Secondary | ICD-10-CM | POA: Diagnosis not present

## 2024-05-29 DIAGNOSIS — F411 Generalized anxiety disorder: Secondary | ICD-10-CM | POA: Diagnosis not present

## 2024-05-29 MED ORDER — SUMATRIPTAN SUCCINATE 50 MG PO TABS: 50.0000 mg | ORAL_TABLET | ORAL | 2 refills | Status: AC | PRN

## 2024-05-29 NOTE — Progress Notes (Signed)
 "  Established Patient Office Visit  Patient ID: Gloria Parsons, female    DOB: 04/21/1976  Age: 49 y.o. MRN: [id number] PCP: Jerrell Cleatus Ned, MD  Chief Complaint  Patient presents with   Follow-up    Patient states dealing with grief. Loss of daughter.     Subjective:     HPI  Discussed the use of AI scribe software for clinical note transcription with the patient, who gave verbal consent to proceed.  History of Present Illness Gloria Parsons is a 49 year old female who presents with grief and anxiety following the loss of her daughter.  She is experiencing significant grief and emotional distress following the loss of her oldest daughter on December 5th, 2025. She describes daily struggles with her emotions, noting that some days are more challenging than others. She attempts to manage her grief by staying busy with work and caring for her other two children. She has flashbacks of finding her daughter, which can occur unexpectedly during routine activities such as driving or conversing with others.  She has been prescribed lorazepam  for anxiety but does not take it regularly due to a general aversion to medication. She is in the process of arranging grief counseling to help manage her symptoms. She is not suicidal and has no intention to harm herself, but is deeply affected by the loss.  She experiences physical symptoms associated with her grief, including shortness of breath, chest pain, and arm pain, particularly in the days immediately following her daughter's passing. These symptoms have improved over time but can recur during periods of intense crying. She also experiences chronic migraines, which she manages with sumatriptan  as needed, approximately once a week.  She took two weeks off work following her daughter's death but returned to maintain a routine and for financial reasons. 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  She is on blood pressure medication, which she does not take regularly. She has no reported side effects from the medication but expresses a general reluctance to take it consistently.     Objective:     BP (!) 156/79   Pulse 85   Temp 98.2 F (36.8 C) (Oral)   Ht 5' 3 (1.6 m)   Wt 172 lb (78 kg)   SpO2 100%   BMI 30.47 kg/m   Physical Exam  Gen: Well-appearing woman Neck: Normal thyroid , no nodules or adenopathy Heart: Regular, no murmur Lungs: Unlabored, clear throughout Ext: Warm, no edema    Assessment & Plan:   Problem List Items Addressed This Visit       High   Hypertension (Chronic)   Chronic uncontrolled asymptomatic hypertension due to inconsistency using medications.  She has picked up Hyzaar, it is at home, she just does not like taking medications on daily basis.  She has a lot going on right now, going through acute grief.  I do not think adding more medications will be helpful.  Will continue to work on this as we go forward.  I recommended using this medicine daily, I think it would help with some of her symptoms of headache and occasional chest discomfort.  Follow-up with me in 3 months.      Migraine (Chronic)   Chronic migraines are exacerbated by stress and grief, requiring increased medication use. Refilled sumatriptan  prescription.      Relevant Medications   SUMAtriptan  (IMITREX ) 50 MG tablet   Grief reaction -  Primary (Chronic)   She is experiencing a grief reaction with emotional distress, flashbacks, and difficulty coping. Engaged in grief counseling and has a supportive network. Symptoms of chest pressure, arm pain, and shortness of breath have improved. No suicidal ideation. Continue grief counseling and encourage engagement with her support system.  After the event I prescribed short course of lorazepam  to be used as needed.  She really has not used this medication, not needed any refills.        Medium     Generalized anxiety disorder (Chronic)   Experiencing anxiety symptoms, including panic attacks, exacerbated by grief. Lorazepam  is prescribed for acute symptoms. Antidepressants are not recommended for grief. Lorazepam  is available for her as needed for acute anxiety symptoms, but she really is not using it.        Return in about 3 months (around 08/27/2024).    Cleatus Debby Specking, MD [place]   "

## 2024-05-29 NOTE — Assessment & Plan Note (Signed)
 Experiencing anxiety symptoms, including panic attacks, exacerbated by grief. Lorazepam  is prescribed for acute symptoms. Antidepressants are not recommended for grief. Lorazepam  is available for her as needed for acute anxiety symptoms, but she really is not using it.

## 2024-05-29 NOTE — Assessment & Plan Note (Signed)
 She is experiencing a grief reaction with emotional distress, flashbacks, and difficulty coping. Engaged in grief counseling and has a supportive network. Symptoms of chest pressure, arm pain, and shortness of breath have improved. No suicidal ideation. Continue grief counseling and encourage engagement with her support system.  After the event I prescribed short course of lorazepam  to be used as needed.  She really has not used this medication, not needed any refills.

## 2024-05-29 NOTE — Assessment & Plan Note (Signed)
 Chronic uncontrolled asymptomatic hypertension due to inconsistency using medications.  She has picked up Hyzaar, it is at home, she just does not like taking medications on daily basis.  She has a lot going on right now, going through acute grief.  I do not think adding more medications will be helpful.  Will continue to work on this as we go forward.  I recommended using this medicine daily, I think it would help with some of her symptoms of headache and occasional chest discomfort.  Follow-up with me in 3 months.

## 2024-05-29 NOTE — Patient Instructions (Signed)
" °  VISIT SUMMARY: During your visit, we discussed your grief and anxiety following the loss of your daughter, as well as your chronic migraines and hypertension. We reviewed your current medications and discussed the importance of consistent management of your conditions.  YOUR PLAN: -GRIEF REACTION DUE TO DEATH OF FAMILY MEMBER: You are experiencing a grief reaction, which includes emotional distress, flashbacks, and difficulty coping. It is important to continue with grief counseling and engage with your support system to help manage these symptoms.  -CHRONIC MIGRAINE: Chronic migraines are severe headaches that can be triggered by stress and grief. You should continue to use sumatriptan  as needed to manage your migraines.  -HYPERTENSION: Hypertension, or high blood pressure, is a condition that can increase your risk of heart problems. It is important to take your blood pressure medication regularly to help control your blood pressure and potentially reduce your headaches.  -GENERALIZED ANXIETY DISORDER: Generalized anxiety disorder involves persistent and excessive worry. Your anxiety symptoms, including panic attacks, are being managed with lorazepam  as needed. It is important to continue using lorazepam  for acute anxiety symptoms as prescribed.  INSTRUCTIONS: Please continue with your grief counseling sessions and engage with your support system. Take your blood pressure medication regularly as prescribed. Use sumatriptan  as needed for migraines and lorazepam  for acute anxiety symptoms. Follow up with your healthcare provider as needed.    "

## 2024-05-29 NOTE — Assessment & Plan Note (Signed)
 Chronic migraines are exacerbated by stress and grief, requiring increased medication use. Refilled sumatriptan  prescription.

## 2024-05-30 ENCOUNTER — Other Ambulatory Visit: Payer: Self-pay | Admitting: Student in an Organized Health Care Education/Training Program

## 2024-05-30 DIAGNOSIS — I10 Essential (primary) hypertension: Secondary | ICD-10-CM

## 2024-08-01 ENCOUNTER — Ambulatory Visit: Admitting: Neurology

## 2024-08-28 ENCOUNTER — Ambulatory Visit: Admitting: Student in an Organized Health Care Education/Training Program
